# Patient Record
Sex: Female | Born: 1970 | Race: White | Hispanic: No | Marital: Married
Health system: Midwestern US, Community
[De-identification: ages and names within clinical notes are randomized; demographics above are authoritative.]

## PROBLEM LIST (undated history)

## (undated) DIAGNOSIS — D72819 Decreased white blood cell count, unspecified: Secondary | ICD-10-CM

## (undated) DIAGNOSIS — M1990 Unspecified osteoarthritis, unspecified site: Secondary | ICD-10-CM

## (undated) DIAGNOSIS — G43909 Migraine, unspecified, not intractable, without status migrainosus: Secondary | ICD-10-CM

## (undated) DIAGNOSIS — F419 Anxiety disorder, unspecified: Secondary | ICD-10-CM

## (undated) DIAGNOSIS — Z1239 Encounter for other screening for malignant neoplasm of breast: Principal | ICD-10-CM

## (undated) DIAGNOSIS — Z01419 Encounter for gynecological examination (general) (routine) without abnormal findings: Secondary | ICD-10-CM

## (undated) HISTORY — DX: Migraine, unspecified, not intractable, without status migrainosus: G43.909

## (undated) HISTORY — DX: Unspecified osteoarthritis, unspecified site: M19.90

## (undated) HISTORY — DX: Decreased white blood cell count, unspecified: D72.819

## (undated) HISTORY — PX: CYSTECTOMY: SUR359

## (undated) HISTORY — PX: GANGLION CYST EXCISION: SHX1691

---

## 2003-12-12 ENCOUNTER — Other Ambulatory Visit: Admission: RE | Admit: 2003-12-12 | Discharge: 2003-12-12 | Payer: Self-pay | Admitting: Obstetrics and Gynecology

## 2004-06-20 ENCOUNTER — Inpatient Hospital Stay (HOSPITAL_COMMUNITY): Admission: AD | Admit: 2004-06-20 | Discharge: 2004-06-20 | Payer: Self-pay | Admitting: Obstetrics and Gynecology

## 2004-07-02 ENCOUNTER — Inpatient Hospital Stay (HOSPITAL_COMMUNITY): Admission: AD | Admit: 2004-07-02 | Discharge: 2004-07-04 | Payer: Self-pay | Admitting: Obstetrics and Gynecology

## 2010-07-19 ENCOUNTER — Emergency Department (HOSPITAL_BASED_OUTPATIENT_CLINIC_OR_DEPARTMENT_OTHER): Admission: EM | Admit: 2010-07-19 | Discharge: 2010-07-19 | Payer: Self-pay | Admitting: Emergency Medicine

## 2010-07-19 ENCOUNTER — Ambulatory Visit: Payer: Self-pay | Admitting: Diagnostic Radiology

## 2011-03-21 NOTE — H&P (Signed)
Tina Hood, Tina Hood                        ACCOUNT NO.:  [id number]   MEDICAL RECORD NO.:  [id number]                   PATIENT TYPE:  INP   LOCATION:  9166                                 FACILITY:  [facility]   PHYSICIAN:  Lenoard Aden, M.D.             DATE OF BIRTH:  21-Jul-1971   DATE OF ADMISSION:  07/02/2004  DATE OF DISCHARGE:                                HISTORY & PHYSICAL   CHIEF COMPLAINT:  Labor.   HISTORY OF PRESENT ILLNESS:  The patient is a 40 year old white female, G2,  P68, EDD July 05, 2004, at 39+ weeks in active labor.   ALLERGIES:  The patient has allergies to Daypro.   MEDICATIONS:  Prenatal vitamins.   PAST OBSTETRICAL HISTORY:  She has a history of a spontaneous vaginal  delivery in April 2002 of a female.  No other medical or surgical  hospitalizations.   PAST MEDICAL HISTORY:  She also has a history of ganglion cyst removal and  migraine headaches.   FAMILY HISTORY:  There is a family history of liver cancer, hypertension,  epilepsy.   PRENATAL LABORATORY DATA:  Blood type 0 positive.  Rubella immune.  Hepatitis negative.  HIV declined.  GBS negative.   PHYSICAL EXAMINATION:  GENERAL:  She is a well-developed, well-nourished  white female in no apparent distress.  HEENT:  Normal.  LUNGS:  Clear.  HEART:  Regular rate and rhythm.  ABDOMEN:  Soft, gravid, nontender.  Estimated fetal weight is 7-1/2 to 8  pounds.  PELVIC:  Cervix is 3-4 cm, 89% effaced, vertex and 0 station.  EXTREMITIES:  No cords.  NEUROLOGICAL:  Nonfocal.   ASSESSMENT:  Term intrauterine pregnancy in active labor.   PLAN:  Proceed with admission and attempts at vaginal delivery.                                               Lenoard Aden, M.D.    RJT/MEDQ  D:  07/02/2004  T:  07/02/2004  Job:  [id number]

## 2011-03-21 NOTE — Op Note (Signed)
NAMEBURGUNDY, MATUSZAK                        ACCOUNT NO.:  [id number]   MEDICAL RECORD NO.:  [id number]                   PATIENT TYPE:  INP   LOCATION:  9112                                 FACILITY:  [facility]   PHYSICIAN:  Lenoard Aden, M.D.             DATE OF BIRTH:  1971-08-06   DATE OF PROCEDURE:  07/02/2004  DATE OF DISCHARGE:                                 OPERATIVE REPORT   INDICATIONS FOR PROCEDURE:  Nonreassuring fetal heart rate tracing with  fetal bradycardia.   DESCRIPTION OF PROCEDURE:  After being apprised of decreased fetal heart  rate in the 70-80 beat per minute range, the patient is apprised of the  risks and benefits of vacuum assistance including small incidence of  cephalohematoma, scalp laceration and intracranial hemorrhage. A kiwi cup is  placed in the fetal vertex LOA less than 45 degrees at +3 to +4 station for  one pull over a second degree midline laceration of a fullterm living female,  Apgar's 8 & 9. Cord blood collected, placenta delivered spontaneously  intact. Three vessel cord noted. Apgar's are 8 & 9. No complications.  Estimated blood loss 500 mL. The whole procedure was done under epidural  anesthetic.  The mother and baby recovering well. Repair is with a 3-0  Rapide, bulb suction was performed.                                               Lenoard Aden, M.D.    RJT/MEDQ  D:  07/02/2004  T:  07/02/2004  Job:  [id number]

## 2011-06-19 ENCOUNTER — Emergency Department (HOSPITAL_BASED_OUTPATIENT_CLINIC_OR_DEPARTMENT_OTHER)
Admission: EM | Admit: 2011-06-19 | Discharge: 2011-06-19 | Disposition: A | Discharge status: Home / Self Care | Payer: Managed Care, Other (non HMO) | Attending: Emergency Medicine | Admitting: Emergency Medicine

## 2011-06-19 ENCOUNTER — Encounter: Payer: Self-pay | Admitting: *Deleted

## 2011-06-19 DIAGNOSIS — IMO0001 Reserved for inherently not codable concepts without codable children: Secondary | ICD-10-CM | POA: Insufficient documentation

## 2011-06-19 DIAGNOSIS — R209 Unspecified disturbances of skin sensation: Secondary | ICD-10-CM | POA: Insufficient documentation

## 2011-06-19 DIAGNOSIS — Y92009 Unspecified place in unspecified non-institutional (private) residence as the place of occurrence of the external cause: Secondary | ICD-10-CM | POA: Insufficient documentation

## 2011-06-19 DIAGNOSIS — W57XXXA Bitten or stung by nonvenomous insect and other nonvenomous arthropods, initial encounter: Secondary | ICD-10-CM

## 2011-06-19 HISTORY — DX: Anxiety disorder, unspecified: F41.9

## 2011-06-19 NOTE — ED Notes (Signed)
Pt st's she was "gardening" and stuck her hand in a glove when she was bit by an ant

## 2011-06-19 NOTE — ED Provider Notes (Signed)
  I performed a history and physical examination of Tina Hood and discussed her management with Ruby Cola, PA.  I agree with the history, physical, assessment, and plan of care, with the following exceptions: None  Minimal swelling present on R hand.  Reaction has resolved as pt took benadryl prior to arrival.  No wheezing  I was present for the following procedures: None Time Spent in Critical Care of the patient: None Time spent in discussions with the patient and family: 5 min  Tildon Husky, MD 06/19/11 1306

## 2011-06-19 NOTE — ED Provider Notes (Signed)
History     CSN: [id number] Arrival date & time: 06/19/2011 12:16 PM  Chief Complaint  Patient presents with  . Insect Bite   HPI Pt was bitten by an insect on right middle finger inside gardening glove this am.  Had immediate pain associated w/ paresthesias that spread up to mid-upper arm.  Took benadryl but symptoms have not improved.  No known insect allergies and denies dyspnea, throat tightness and tongue/lip edema.  Her son saw insect and believes it was a red velvet ant.  Concerned that the insect may have been poisonous.    Past Medical History  Diagnosis Date  . Anxiety     Past Surgical History  Procedure Date  . Ganglion cyst excision     No family history on file.  History  Substance Use Topics  . Smoking status: Never Smoker   . Smokeless tobacco: Not on file  . Alcohol Use: Yes    OB History    Grav Para Term Preterm Abortions TAB SAB Ect Mult Living                  Review of Systems  All other systems reviewed and are negative.    Physical Exam  BP 111/54  Pulse 86  Temp(Src) 98.6 F (37 C) (Oral)  Resp 20  Ht 5\' 4"  (1.626 m)  Wt 140 lb (63.504 kg)  BMI 24.03 kg/m2  SpO2 100%  LMP 05/21/2011  Physical Exam  Nursing note and vitals reviewed. Constitutional: She is oriented to person, place, and time. She appears well-developed and well-nourished. No distress.  HENT:  Head: Normocephalic and atraumatic.  Mouth/Throat: Oropharynx is clear and moist.       No lip/tongue edema  Eyes:       Normal appearance  Neck: Normal range of motion.  Cardiovascular: Normal rate and regular rhythm.   Pulmonary/Chest: Effort normal and breath sounds normal.  Neurological: She is alert and oriented to person, place, and time.  Skin: Skin is warm and dry. No rash noted.       1mm, erythematous, macular lesion on medial cuticle of right middle finger.  Non-tender.  Full ROM of DIP and PIP joints. Sensation of entire RUE intact.    Psychiatric:   Anxious appearing    ED Course  Procedures  MDM Pt presents w/ painful insect bite of right middle finger w/ associated paresthesias.  No signs of infection or anaphylaxis on exam.  Pt reassured and return precautions discussed.  Recommended ice and benadryl.      Otilio Miu, [place] 06/19/11 1252

## 2011-06-19 NOTE — ED Notes (Signed)
Patient states she put on a pair of gloves from the garage and felt a bite on her 3rd right finger tip.  Took off gloves and shook them out.  States a red ant fell out.  Took benadryl 25mg  and used ice approximately one hour ago.  States she is having a tingling sensation numb and tingling.

## 2015-07-20 LAB — HM PAP SMEAR
PAP Smear, External: NORMAL
Pap Smear, External: NORMAL

## 2017-02-25 DIAGNOSIS — R053 Chronic cough: Secondary | ICD-10-CM | POA: Insufficient documentation

## 2017-02-25 DIAGNOSIS — J45909 Unspecified asthma, uncomplicated: Secondary | ICD-10-CM | POA: Insufficient documentation

## 2017-02-25 DIAGNOSIS — J309 Allergic rhinitis, unspecified: Secondary | ICD-10-CM | POA: Insufficient documentation

## 2019-05-20 ENCOUNTER — Ambulatory Visit: Attending: Family Medicine | Primary: Family Medicine

## 2019-05-20 ENCOUNTER — Ambulatory Visit
Admit: 2019-05-20 | Discharge: 2019-05-20 | Payer: PRIVATE HEALTH INSURANCE | Attending: Family Medicine | Primary: Family Medicine

## 2019-05-20 DIAGNOSIS — Z0001 Encounter for general adult medical examination with abnormal findings: Secondary | ICD-10-CM

## 2019-05-20 NOTE — Progress Notes (Signed)
 Chief Complaint   Patient presents with    Establish Care

## 2019-05-20 NOTE — Addendum Note (Signed)
Addendum Note by Scheryl Marten, MD at 05/20/19 1100                Author: Scheryl Marten, MD  Service: --  Author Type: Physician       Filed: 06/15/19 1659  Encounter Date: 05/20/2019  Status: Signed          Editor: Scheryl Marten, MD (Physician)          Addended by: Scheryl Marten on: 06/15/2019 04:59 PM    Modules accepted: Orders

## 2019-05-20 NOTE — Progress Notes (Signed)
Family Medicine Initial Office Visit  Patient: Anita SpiceMelanie Toulouse  1971-07-06, 48 y.o., female  Encounter Date: 05/20/2019    ASSESSMENT & PLAN    ICD-10-CM ICD-9-CM    1. Encounter for well adult exam with abnormal findings  Z00.01 V70.0    2. Anxiety  F41.9 300.00 DRUG SCREEN, URINE   3. Chronic prescription benzodiazepine use  Z79.899 V58.69 DRUG SCREEN, URINE   4. Dense breast tissue on mammogram  R92.2 793.89 MAM 3D TOMO W MAMMO BI SCREENING INCL CAD   5. Screening for breast cancer  Z12.39 V76.10 MAM 3D TOMO W MAMMO BI SCREENING INCL CAD   6. Screening for lipid disorders  Z13.220 V77.91 METABOLIC PANEL, COMPREHENSIVE      LIPID PANEL   7. Screening for diabetes mellitus  Z13.1 V77.1 CBC W/O DIFF      METABOLIC PANEL, COMPREHENSIVE     Orders Placed This Encounter   ??? MAM 3D TOMO W MAMMO BI SCREENING INCL CAD     Standing Status:   Future     Standing Expiration Date:   06/19/2020     Order Specific Question:   Is Patient Pregnant?     Answer:   No   ??? DRUG SCREEN, URINE     Standing Status:   Future     Standing Expiration Date:   05/19/2020   ??? CBC W/O DIFF     Standing Status:   Future     Standing Expiration Date:   05/19/2020   ??? METABOLIC PANEL, COMPREHENSIVE     Standing Status:   Future     Standing Expiration Date:   05/19/2020   ??? LIPID PANEL     Standing Status:   Future     Standing Expiration Date:   05/19/2020   ??? cetirizine-pseudoePHEDrine (ZyrTEC-D) 5-120 mg Tb12     Sig: Take 1 Tab by mouth two (2) times a day.   ??? B.infantis-B.ani-B.long-B.bifi (Probiotic 4X) 10-15 mg TbEC     Sig: Take  by mouth.   ??? multivitamin (ONE A DAY) tablet     Sig: Take 1 Tab by mouth daily.   ??? ALPRAZolam (Xanax) 0.5 mg tablet     Sig: Take  by mouth.   ??? [facility] PAP SMEAR     This external order was created through the Results Console.     Patient Instructions   1. Encounter for well adult exam with abnormal findings  Well-appearing adult female presents today to establish care and for her well adult exam, reviewed health  maintenance, we were able to find that her Pap smears done within the past 5 years and we updated it in the chart, she will be due next year and she will also be due for colon cancer screening at that time.  She was prepared for this today.  She is up-to-date on her tetanus vaccine.  She gets her flu shot annually.  She otherwise eats a healthy diet and exercises regularly with a goal of staying healthy.  Anticipatory guidance today  2. Anxiety  Using about 60 Xanax a year, she does not have symptoms of daily anxiety but instead seems to be having panic attacks periodically.  She would like to continue on this for now as long she does not need to increase her dose.  She will go for urine drug screening, I did review the PMP, her last prescription was in 2018 and she is just getting you are running out from this prescription.  She is otherwise feeling healthy and reports to me that this has been the thing that has helped with her panic attacks.  - DRUG SCREEN, URINE; Future    3. Chronic prescription benzodiazepine use  Discussed are controlled substance prescribing contract, patient's understanding and agreeable, shoulder for urine drug screen and if negative I will prescribe 60 Xanax, she is needing to increase her dose she will need to discuss with me the potential for using an SSRI instead  - DRUG SCREEN, URINE; Future    4. Dense breast tissue on mammogram  History of dense breast tissue on mammogram, patient would prefer to have a 3D mammogram rather than a traditional screening mammogram, I have ordered this at her behest and I recommend she get annual mammogram screenings  - MAM 3D TOMO W MAMMO BI SCREENING INCL CAD; Future    5. Screening for breast cancer  As above  - MAM 3D TOMO W MAMMO BI SCREENING INCL CAD; Future    6. Screening for lipid disorders  Routine screening  - METABOLIC PANEL, COMPREHENSIVE; Future  - LIPID PANEL; Future    7. Screening for diabetes mellitus  Routine screening  - CBC W/O DIFF;  Future  - METABOLIC PANEL, COMPREHENSIVE; Future        CHIEF COMPLAINT  Chief Complaint   Patient presents with   ??? Establish Care       SUBJECTIVE  Anita Roman is a 48 y.o. female presenting today for establishing care.   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  Patient lives in a house with husband and 2 boys 5118 and 15 and dog  Patient was [place] seen by primary care 2 years ago (in [place], [facility])  Patient last saw a dentist in the last year  Patient last had eye exam a few months ago    Exercise: walking tread climber, eliptical, stength 5 days a week  Diet / Weight:eating healthy, weight is stable, niece is a Systems analystersonal Trainer    Tobacco:no  EtOH:occassional  Illicit Substances:no    Sexual Activity: yes one female partner  Gynecologist: no  Last Pap smear 2016, normal  Reviewed from care everywhere    Has anxiety, she reports that she last got her prescription in 2018 and she is just run out of it. She reports that she takes about 60 pills a year or more.     Reports hx of low vit k as well  Review of Systems  A 12 point review of systems was negative except as noted here or in the HPI.    OBJECTIVE  Visit Vitals  BP 112/74   Pulse 89   Temp 97 ??F (36.1 ??C) (Temporal)   Resp 20   Ht 5\' 4"  (1.626 m)   Wt 159 lb (72.1 kg)   SpO2 99%   BMI 27.29 kg/m??       Physical Exam  Vitals signs and nursing note reviewed.   Constitutional:       General: She is not in acute distress.     Appearance: Normal appearance. She is well-developed. She is not diaphoretic.   HENT:      Head: Normocephalic and atraumatic.      Right Ear: External ear normal.      Left Ear: External ear normal.      Nose: Nose normal. No congestion.      Mouth/Throat:      Mouth: Mucous membranes are moist.  Pharynx: Oropharynx is clear. No oropharyngeal exudate or posterior oropharyngeal erythema.   Eyes:      General: No scleral icterus.        Right eye: No discharge.         Left eye: No discharge.       Extraocular Movements: Extraocular movements intact.      Conjunctiva/sclera: Conjunctivae normal.      Pupils: Pupils are equal, round, and reactive to light.   Neck:      Musculoskeletal: Normal range of motion and neck supple.      Vascular: No carotid bruit.   Cardiovascular:      Rate and Rhythm: Normal rate and regular rhythm.      Heart sounds: Normal heart sounds. No murmur. No friction rub. No gallop.    Pulmonary:      Effort: Pulmonary effort is normal. No respiratory distress.      Breath sounds: Normal breath sounds. No stridor. No wheezing, rhonchi or rales.   Abdominal:      General: Bowel sounds are normal. There is no distension.      Palpations: Abdomen is soft.      Tenderness: There is no abdominal tenderness.   Musculoskeletal: Normal range of motion.         General: No tenderness.      Right lower leg: No edema.      Left lower leg: No edema.   Lymphadenopathy:      Cervical: No cervical adenopathy.   Skin:     General: Skin is warm and dry.      Capillary Refill: Capillary refill takes less than 2 seconds.      Findings: No rash.   Neurological:      General: No focal deficit present.      Mental Status: She is alert and oriented to person, place, and time.      Coordination: Coordination normal.      Gait: Gait normal.   Psychiatric:         Mood and Affect: Mood normal.         Behavior: Behavior normal.         Thought Content: Thought content normal.         Judgment: Judgment normal.         Results for orders placed or performed in visit on 05/20/19   HM PAP SMEAR   Result Value Ref Range    Pap Smear, External normal        HISTORICAL  Reviewed and updated today, and as noted below:    Past Medical History:   Diagnosis Date   ??? Allergies    ??? Anxiety      Past Surgical History:   Procedure Laterality Date   ??? HX CYSTECTOMY       Family History   Problem Relation Age of Onset   ??? Hypertension Mother    ??? Hypertension Father    ??? Cancer Father    ??? Cancer Sister      Social History      Tobacco Use   Smoking Status Never Smoker   Smokeless Tobacco Never Used     Social History     Socioeconomic History   ??? Marital status: MARRIED     Spouse name: Not on file   ??? Number of children: Not on file   ??? Years of education: Not on file   ??? Highest education level: Not on file  Tobacco Use   ??? Smoking status: Never Smoker   ??? Smokeless tobacco: Never Used   Substance and Sexual Activity   ??? Alcohol use: Yes   ??? Drug use: Not Currently     Allergies   Allergen Reactions   ??? Insect Venom Anaphylaxis   ??? Daypro [Oxaprozin] Hives   ??? Lodine [Etodolac] Hives       Office Visit on 05/20/2019   Component Date Value Ref Range Status   ??? Pap Smear, External 07/20/2015 normal   Final         Desma Maximaitlin Kelly Detrell Umscheid, MD  [facility]  05/20/19 11:43 AM    Portions of this note may have been populated using smart dictation software and may have "sounds-like" errors present.     Pt was counseled on risks, benefits and alternatives of treatment options. All questions were asked and answered and the patient was agreeable with the treatment plan as outlined.

## 2019-05-20 NOTE — Addendum Note (Signed)
Addended by: Scheryl Marten on: 06/15/2019 04:59 PM     Modules accepted: Orders

## 2019-05-20 NOTE — Progress Notes (Signed)
Chief Complaint   Patient presents with   ??? Establish Care

## 2019-05-20 NOTE — Patient Instructions (Signed)
1. Encounter for well adult exam with abnormal findings  Well-appearing adult female presents today to establish care and for her well adult exam, reviewed health maintenance, we were able to find that her Pap smears done within the past 5 years and we updated it in the chart, she will be due next year and she will also be due for colon cancer screening at that time.  She was prepared for this today.  She is up-to-date on her tetanus vaccine.  She gets her flu shot annually.  She otherwise eats a healthy diet and exercises regularly with a goal of staying healthy.  Anticipatory guidance today  2. Anxiety  Using about 60 Xanax a year, she does not have symptoms of daily anxiety but instead seems to be having panic attacks periodically.  She would like to continue on this for now as long she does not need to increase her dose.  She will go for urine drug screening, I did review the PMP, her last prescription was in 2018 and she is just getting you are running out from this prescription.  She is otherwise feeling healthy and reports to me that this has been the thing that has helped with her panic attacks.  - DRUG SCREEN, URINE; Future    3. Chronic prescription benzodiazepine use  Discussed are controlled substance prescribing contract, patient's understanding and agreeable, shoulder for urine drug screen and if negative I will prescribe 60 Xanax, she is needing to increase her dose she will need to discuss with me the potential for using an SSRI instead  - DRUG SCREEN, URINE; Future    4. Dense breast tissue on mammogram  History of dense breast tissue on mammogram, patient would prefer to have a 3D mammogram rather than a traditional screening mammogram, I have ordered this at her behest and I recommend she get annual mammogram screenings  - MAM 3D TOMO W MAMMO BI SCREENING INCL CAD; Future    5. Screening for breast cancer  As above  - MAM 3D TOMO W MAMMO BI SCREENING INCL CAD; Future    6. Screening for lipid  disorders  Routine screening  - METABOLIC PANEL, COMPREHENSIVE; Future  - LIPID PANEL; Future    7. Screening for diabetes mellitus  Routine screening  - CBC W/O DIFF; Future  - METABOLIC PANEL, COMPREHENSIVE; Future

## 2019-05-20 NOTE — Progress Notes (Signed)
Family Medicine Initial Office Visit  Patient: Anita Roman  [birth year], 48 y.o., female  Encounter Date: 05/20/2019    ASSESSMENT & PLAN    ICD-10-CM ICD-9-CM    1. Encounter for well adult exam with abnormal findings  Z00.01 V70.0    2. Anxiety  F41.9 300.00 DRUG SCREEN, URINE   3. Chronic prescription benzodiazepine use  Z79.899 V58.69 DRUG SCREEN, URINE   4. Dense breast tissue on mammogram  R92.2 793.89 MAM 3D TOMO W MAMMO BI SCREENING INCL CAD   5. Screening for breast cancer  Z12.39 V76.10 MAM 3D TOMO W MAMMO BI SCREENING INCL CAD   6. Screening for lipid disorders  Z13.220 V77.91 METABOLIC PANEL, COMPREHENSIVE      LIPID PANEL   7. Screening for diabetes mellitus  Z13.1 V77.1 CBC W/O DIFF      METABOLIC PANEL, COMPREHENSIVE     Orders Placed This Encounter   ??? MAM 3D TOMO W MAMMO BI SCREENING INCL CAD     Standing Status:   Future     Standing Expiration Date:   06/19/2020     Order Specific Question:   Is Patient Pregnant?     Answer:   No   ??? DRUG SCREEN, URINE     Standing Status:   Future     Standing Expiration Date:   05/19/2020   ??? CBC W/O DIFF     Standing Status:   Future     Standing Expiration Date:   05/19/2020   ??? METABOLIC PANEL, COMPREHENSIVE     Standing Status:   Future     Standing Expiration Date:   05/19/2020   ??? LIPID PANEL     Standing Status:   Future     Standing Expiration Date:   05/19/2020   ??? cetirizine-pseudoePHEDrine (ZyrTEC-D) 5-120 mg Tb12     Sig: Take 1 Tab by mouth two (2) times a day.   ??? B.infantis-B.ani-B.long-B.bifi (Probiotic 4X) 10-15 mg TbEC     Sig: Take  by mouth.   ??? multivitamin (ONE A DAY) tablet     Sig: Take 1 Tab by mouth daily.   ??? ALPRAZolam (Xanax) 0.5 mg tablet     Sig: Take  by mouth.   ??? HM PAP SMEAR     This external order was created through the Results Console.     Patient Instructions   1. Encounter for well adult exam with abnormal findings  Well-appearing adult female presents today to establish care and for her  well adult exam, reviewed health maintenance, we were able to find that her Pap smears done within the past 5 years and we updated it in the chart, she will be due next year and she will also be due for colon cancer screening at that time.  She was prepared for this today.  She is up-to-date on her tetanus vaccine.  She gets her flu shot annually.  She otherwise eats a healthy diet and exercises regularly with a goal of staying healthy.  Anticipatory guidance today  2. Anxiety  Using about 60 Xanax a year, she does not have symptoms of daily anxiety but instead seems to be having panic attacks periodically.  She would like to continue on this for now as long she does not need to increase her dose.  She will go for urine drug screening, I did review the PMP, her last prescription was in 2018 and she is just getting you are running out from this prescription.  She is otherwise feeling healthy and reports to me that this has been the thing that has helped with her panic attacks.  - DRUG SCREEN, URINE; Future    3. Chronic prescription benzodiazepine use  Discussed are controlled substance prescribing contract, patient's understanding and agreeable, shoulder for urine drug screen and if negative I will prescribe 60 Xanax, she is needing to increase her dose she will need to discuss with me the potential for using an SSRI instead  - DRUG SCREEN, URINE; Future    4. Dense breast tissue on mammogram  History of dense breast tissue on mammogram, patient would prefer to have a 3D mammogram rather than a traditional screening mammogram, I have ordered this at her behest and I recommend she get annual mammogram screenings  - MAM 3D TOMO W MAMMO BI SCREENING INCL CAD; Future    5. Screening for breast cancer  As above  - MAM 3D TOMO W MAMMO BI SCREENING INCL CAD; Future    6. Screening for lipid disorders  Routine screening  - METABOLIC PANEL, COMPREHENSIVE; Future  - LIPID PANEL; Future    7. Screening for diabetes mellitus   Routine screening  - CBC W/O DIFF; Future  - METABOLIC PANEL, COMPREHENSIVE; Future        CHIEF COMPLAINT  Chief Complaint   Patient presents with   ??? Establish Care       SUBJECTIVE  Anita Roman is a 48 y.o. female presenting today for establishing care.   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, Scientist, physiological  Patient lives in a house with husband and 2 boys 32 and 60 and dog  Patient was last seen by primary care 2 years ago (in [place], [facility])  Patient last saw a dentist in the last year  Patient last had eye exam a few months ago    Exercise: walking tread climber, eliptical, stength 5 days a week  Diet / Weight:eating healthy, weight is stable, niece is a Physiological scientist    Tobacco:no  EtOH:occassional  Illicit Substances:no    Sexual Activity: yes one female partner  Gynecologist: no  Last Pap smear 2016, normal  Reviewed from care everywhere    Has anxiety, she reports that she last got her prescription in 2018 and she is just run out of it. She reports that she takes about 60 pills a year or more.     Reports hx of low vit k as well  Review of Systems  A 12 point review of systems was negative except as noted here or in the HPI.    OBJECTIVE  Visit Vitals  BP 112/74   Pulse 89   Temp 97 ??F (36.1 ??C) (Temporal)   Resp 20   Ht 5\' 4"  (1.626 m)   Wt 159 lb (72.1 kg)   SpO2 99%   BMI 27.29 kg/m??       Physical Exam  Vitals signs and nursing note reviewed.   Constitutional:       General: She is not in acute distress.     Appearance: Normal appearance. She is well-developed. She is not diaphoretic.   HENT:      Head: Normocephalic and atraumatic.      Right Ear: External ear normal.      Left Ear: External ear normal.      Nose: Nose normal. No congestion.      Mouth/Throat:      Mouth: Mucous membranes are moist.  Pharynx: Oropharynx is clear. No oropharyngeal exudate or posterior oropharyngeal erythema.   Eyes:      General: No scleral icterus.        Right eye: No discharge.          Left eye: No discharge.      Extraocular Movements: Extraocular movements intact.      Conjunctiva/sclera: Conjunctivae normal.      Pupils: Pupils are equal, round, and reactive to light.   Neck:      Musculoskeletal: Normal range of motion and neck supple.      Vascular: No carotid bruit.   Cardiovascular:      Rate and Rhythm: Normal rate and regular rhythm.      Heart sounds: Normal heart sounds. No murmur. No friction rub. No gallop.    Pulmonary:      Effort: Pulmonary effort is normal. No respiratory distress.      Breath sounds: Normal breath sounds. No stridor. No wheezing, rhonchi or rales.   Abdominal:      General: Bowel sounds are normal. There is no distension.      Palpations: Abdomen is soft.      Tenderness: There is no abdominal tenderness.   Musculoskeletal: Normal range of motion.         General: No tenderness.      Right lower leg: No edema.      Left lower leg: No edema.   Lymphadenopathy:      Cervical: No cervical adenopathy.   Skin:     General: Skin is warm and dry.      Capillary Refill: Capillary refill takes less than 2 seconds.      Findings: No rash.   Neurological:      General: No focal deficit present.      Mental Status: She is alert and oriented to person, place, and time.      Coordination: Coordination normal.      Gait: Gait normal.   Psychiatric:         Mood and Affect: Mood normal.         Behavior: Behavior normal.         Thought Content: Thought content normal.         Judgment: Judgment normal.         Results for orders placed or performed in visit on 05/20/19   HM PAP SMEAR   Result Value Ref Range    Pap Smear, External normal        HISTORICAL  Reviewed and updated today, and as noted below:    Past Medical History:   Diagnosis Date   ??? Allergies    ??? Anxiety      Past Surgical History:   Procedure Laterality Date   ??? HX CYSTECTOMY       Family History   Problem Relation Age of Onset   ??? Hypertension Mother    ??? Hypertension Father    ??? Cancer Father     ??? Cancer Sister      Social History     Tobacco Use   Smoking Status Never Smoker   Smokeless Tobacco Never Used     Social History     Socioeconomic History   ??? Marital status: MARRIED     Spouse name: Not on file   ??? Number of children: Not on file   ??? Years of education: Not on file   ??? Highest education level: Not on file  Tobacco Use   ??? Smoking status: Never Smoker   ??? Smokeless tobacco: Never Used   Substance and Sexual Activity   ??? Alcohol use: Yes   ??? Drug use: Not Currently     Allergies   Allergen Reactions   ??? Insect Venom Anaphylaxis   ??? Daypro [Oxaprozin] Hives   ??? Lodine [Etodolac] Hives       Office Visit on 05/20/2019   Component Date Value Ref Range Status   ??? Pap Smear, External 07/20/2015 normal   Final         Desma Maximaitlin Kelly Detrell Umscheid, MD  [facility]  05/20/19 11:43 AM    Portions of this note may have been populated using smart dictation software and may have "sounds-like" errors present.     Pt was counseled on risks, benefits and alternatives of treatment options. All questions were asked and answered and the patient was agreeable with the treatment plan as outlined.

## 2019-06-15 ENCOUNTER — Inpatient Hospital Stay: Admit: 2019-06-15 | Payer: BLUE CROSS/BLUE SHIELD | Primary: Family Medicine

## 2019-06-15 LAB — LIPID PANEL
CHOL/HDL Ratio: 1.9 (ref 0.0–5.0)
Chol/HDL Ratio: 1.9 (ref 0.0–5.0)
Cholesterol, Total: 200 MG/DL — ABNORMAL HIGH (ref <200)
Cholesterol, total: 200 MG/DL — ABNORMAL HIGH (ref <200)
HDL Cholesterol: 103 MG/DL
HDL: 103 MG/DL
LDL Calculated: 87.4 MG/DL (ref 0–100)
LDL, calculated: 87.4 MG/DL (ref 0–100)
Triglyceride: 48 MG/DL (ref <150)
Triglycerides: 48 MG/DL (ref <150)
VLDL Cholesterol Calculated: 9.6 MG/DL
VLDL, calculated: 9.6 MG/DL

## 2019-06-15 LAB — DRUG SCREEN, URINE
AMPHETAMINES: NEGATIVE
Amphetamine Screen, Ur: NEGATIVE
BARBITURATES: NEGATIVE
BENZODIAZEPINES: NEGATIVE
Barbiturate Screen, Ur: NEGATIVE
Benzodiazepine Screen, Urine: NEGATIVE
COCAINE: NEGATIVE
Cocaine Screen Urine: NEGATIVE
METHADONE: NEGATIVE
Methadone Screen, Urine: NEGATIVE
OPIATES: NEGATIVE
Opiate Scrn, Ur: NEGATIVE
PCP(PHENCYCLIDINE): NEGATIVE
Phencyclidine (PCP), Screen, Urine: NEGATIVE
THC (TH-CANNABINOL): NEGATIVE
THC Screen, Urine: NEGATIVE

## 2019-06-15 LAB — COMPREHENSIVE METABOLIC PANEL
ALT: 17 U/L (ref 12–78)
AST: 19 U/L (ref 15–37)
Albumin/Globulin Ratio: 1.3 (ref 1.1–2.2)
Albumin: 4.5 g/dL (ref 3.5–5.0)
Alkaline Phosphatase: 79 U/L (ref 45–117)
Anion Gap: 7 mmol/L (ref 5–15)
BUN/Creatinine Ratio: 13 (ref 12–20)
BUN: 13 MG/DL (ref 6–20)
CO2: 25 mmol/L (ref 21–32)
Calcium: 9 MG/DL (ref 8.5–10.1)
Chloride: 104 mmol/L (ref 97–108)
Creatinine: 1 MG/DL (ref 0.55–1.02)
GFR African American: >60 mL/min/{1.73_m2} (ref >60)
Globulin: 3.4 g/dL (ref 2.0–4.0)
Glucose: 90 mg/dL (ref 65–100)
Potassium: 3.9 mmol/L (ref 3.5–5.1)
Sodium: 136 mmol/L (ref 136–145)
Total Bilirubin: 0.6 MG/DL (ref 0.2–1.0)
Total Protein: 7.9 g/dL (ref 6.4–8.2)
eGFR NON-AA: 59 mL/min/{1.73_m2} — ABNORMAL LOW (ref >60)

## 2019-06-15 LAB — CBC
Hematocrit: 43.9 % (ref 35.0–47.0)
Hemoglobin: 13.9 g/dL (ref 11.5–16.0)
MCH: 30 PG (ref 26.0–34.0)
MCHC: 31.7 g/dL (ref 30.0–36.5)
MCV: 94.8 FL (ref 80.0–99.0)
MPV: 9.8 FL (ref 8.9–12.9)
NRBC Absolute: 0 10*3/uL (ref 0.00–0.01)
Nucleated RBCs: 0 PER 100 WBC
Platelets: 252 10*3/uL (ref 150–400)
RBC: 4.63 M/uL (ref 3.80–5.20)
RDW: 12.6 % (ref 11.5–14.5)
WBC: 4.5 10*3/uL (ref 3.6–11.0)

## 2019-06-15 LAB — METABOLIC PANEL, COMPREHENSIVE
A-G Ratio: 1.3 (ref 1.1–2.2)
ALT (SGPT): 17 U/L (ref 12–78)
AST (SGOT): 19 U/L (ref 15–37)
Albumin: 4.5 g/dL (ref 3.5–5.0)
Alk. phosphatase: 79 U/L (ref 45–117)
Anion gap: 7 mmol/L (ref 5–15)
BUN/Creatinine ratio: 13 (ref 12–20)
BUN: 13 MG/DL (ref 6–20)
Bilirubin, total: 0.6 MG/DL (ref 0.2–1.0)
CO2: 25 mmol/L (ref 21–32)
Calcium: 9 MG/DL (ref 8.5–10.1)
Chloride: 104 mmol/L (ref 97–108)
Creatinine: 1 MG/DL (ref 0.55–1.02)
GFR est AA: >60 mL/min/{1.73_m2} (ref >60)
GFR est non-AA: 59 mL/min/{1.73_m2} — ABNORMAL LOW (ref >60)
Globulin: 3.4 g/dL (ref 2.0–4.0)
Glucose: 90 mg/dL (ref 65–100)
Potassium: 3.9 mmol/L (ref 3.5–5.1)
Protein, total: 7.9 g/dL (ref 6.4–8.2)
Sodium: 136 mmol/L (ref 136–145)

## 2019-06-15 LAB — CBC W/O DIFF
ABSOLUTE NRBC: 0 10*3/uL (ref 0.00–0.01)
HCT: 43.9 % (ref 35.0–47.0)
HGB: 13.9 g/dL (ref 11.5–16.0)
MCH: 30 PG (ref 26.0–34.0)
MCHC: 31.7 g/dL (ref 30.0–36.5)
MCV: 94.8 FL (ref 80.0–99.0)
MPV: 9.8 FL (ref 8.9–12.9)
NRBC: 0 PER 100 WBC
PLATELET: 252 10*3/uL (ref 150–400)
RBC: 4.63 M/uL (ref 3.80–5.20)
RDW: 12.6 % (ref 11.5–14.5)
WBC: 4.5 10*3/uL (ref 3.6–11.0)

## 2019-06-15 MED ORDER — ALPRAZOLAM 0.5 MG TAB
0.5 mg | ORAL_TABLET | Freq: Three times a day (TID) | ORAL | 0 refills | Status: DC | PRN
Start: 2019-06-15 — End: 2020-05-03

## 2019-06-15 NOTE — Progress Notes (Signed)
Labs still pending but CBC blood counts are all normal

## 2019-06-15 NOTE — Progress Notes (Signed)
UDS normal as expected, sent xanax to your pharmacy as we discussed  Normal blood count  Normal metabolic panel  Cholesterol "looks" hight because healthy HDL cholesterol is high, no need to treat this but monitor instead, good low triglycerides, LDL at range

## 2019-06-15 NOTE — Progress Notes (Signed)
Labs still pending but CBC blood counts are all normal

## 2019-06-15 NOTE — Progress Notes (Signed)
UDS normal as expected, sent xanax to your pharmacy as we discussed  Normal blood count  Normal metabolic panel  Cholesterol "looks" hight because healthy HDL cholesterol is high, no need to treat this but monitor instead, good low triglycerides, LDL at range

## 2020-01-24 DIAGNOSIS — M9904 Segmental and somatic dysfunction of sacral region: Secondary | ICD-10-CM | POA: Diagnosis not present

## 2020-01-24 DIAGNOSIS — M5386 Other specified dorsopathies, lumbar region: Secondary | ICD-10-CM | POA: Diagnosis not present

## 2020-01-24 DIAGNOSIS — M531 Cervicobrachial syndrome: Secondary | ICD-10-CM | POA: Diagnosis not present

## 2020-01-24 DIAGNOSIS — M9903 Segmental and somatic dysfunction of lumbar region: Secondary | ICD-10-CM | POA: Diagnosis not present

## 2020-01-25 DIAGNOSIS — Z1231 Encounter for screening mammogram for malignant neoplasm of breast: Secondary | ICD-10-CM | POA: Diagnosis not present

## 2020-01-25 LAB — HM MAMMOGRAPHY
Mammography, External: NORMAL
Mammography, External: NORMAL

## 2020-02-20 ENCOUNTER — Encounter

## 2020-03-02 DIAGNOSIS — M531 Cervicobrachial syndrome: Secondary | ICD-10-CM | POA: Diagnosis not present

## 2020-03-02 DIAGNOSIS — M9903 Segmental and somatic dysfunction of lumbar region: Secondary | ICD-10-CM | POA: Diagnosis not present

## 2020-03-02 DIAGNOSIS — M9904 Segmental and somatic dysfunction of sacral region: Secondary | ICD-10-CM | POA: Diagnosis not present

## 2020-03-02 DIAGNOSIS — M5386 Other specified dorsopathies, lumbar region: Secondary | ICD-10-CM | POA: Diagnosis not present

## 2020-04-13 DIAGNOSIS — M531 Cervicobrachial syndrome: Secondary | ICD-10-CM | POA: Diagnosis not present

## 2020-04-13 DIAGNOSIS — M9904 Segmental and somatic dysfunction of sacral region: Secondary | ICD-10-CM | POA: Diagnosis not present

## 2020-04-13 DIAGNOSIS — M9903 Segmental and somatic dysfunction of lumbar region: Secondary | ICD-10-CM | POA: Diagnosis not present

## 2020-04-13 DIAGNOSIS — M5386 Other specified dorsopathies, lumbar region: Secondary | ICD-10-CM | POA: Diagnosis not present

## 2020-05-03 ENCOUNTER — Encounter

## 2020-05-04 MED ORDER — ALPRAZOLAM 0.5 MG TAB
0.5 mg | ORAL_TABLET | Freq: Three times a day (TID) | ORAL | 0 refills | Status: DC | PRN
Start: 2020-05-04 — End: 2020-10-22

## 2020-05-11 DIAGNOSIS — M531 Cervicobrachial syndrome: Secondary | ICD-10-CM | POA: Diagnosis not present

## 2020-05-11 DIAGNOSIS — M5386 Other specified dorsopathies, lumbar region: Secondary | ICD-10-CM | POA: Diagnosis not present

## 2020-05-11 DIAGNOSIS — M9903 Segmental and somatic dysfunction of lumbar region: Secondary | ICD-10-CM | POA: Diagnosis not present

## 2020-05-11 DIAGNOSIS — M9904 Segmental and somatic dysfunction of sacral region: Secondary | ICD-10-CM | POA: Diagnosis not present

## 2020-06-22 DIAGNOSIS — M9904 Segmental and somatic dysfunction of sacral region: Secondary | ICD-10-CM | POA: Diagnosis not present

## 2020-06-22 DIAGNOSIS — M531 Cervicobrachial syndrome: Secondary | ICD-10-CM | POA: Diagnosis not present

## 2020-06-22 DIAGNOSIS — M9903 Segmental and somatic dysfunction of lumbar region: Secondary | ICD-10-CM | POA: Diagnosis not present

## 2020-06-22 DIAGNOSIS — M5386 Other specified dorsopathies, lumbar region: Secondary | ICD-10-CM | POA: Diagnosis not present

## 2020-07-27 DIAGNOSIS — M9904 Segmental and somatic dysfunction of sacral region: Secondary | ICD-10-CM | POA: Diagnosis not present

## 2020-07-27 DIAGNOSIS — M531 Cervicobrachial syndrome: Secondary | ICD-10-CM | POA: Diagnosis not present

## 2020-07-27 DIAGNOSIS — M9903 Segmental and somatic dysfunction of lumbar region: Secondary | ICD-10-CM | POA: Diagnosis not present

## 2020-07-27 DIAGNOSIS — M5386 Other specified dorsopathies, lumbar region: Secondary | ICD-10-CM | POA: Diagnosis not present

## 2020-08-24 DIAGNOSIS — M5386 Other specified dorsopathies, lumbar region: Secondary | ICD-10-CM | POA: Diagnosis not present

## 2020-08-24 DIAGNOSIS — M531 Cervicobrachial syndrome: Secondary | ICD-10-CM | POA: Diagnosis not present

## 2020-08-24 DIAGNOSIS — M9903 Segmental and somatic dysfunction of lumbar region: Secondary | ICD-10-CM | POA: Diagnosis not present

## 2020-08-24 DIAGNOSIS — M9904 Segmental and somatic dysfunction of sacral region: Secondary | ICD-10-CM | POA: Diagnosis not present

## 2020-09-03 DIAGNOSIS — M531 Cervicobrachial syndrome: Secondary | ICD-10-CM | POA: Diagnosis not present

## 2020-09-03 DIAGNOSIS — M5386 Other specified dorsopathies, lumbar region: Secondary | ICD-10-CM | POA: Diagnosis not present

## 2020-09-03 DIAGNOSIS — M9904 Segmental and somatic dysfunction of sacral region: Secondary | ICD-10-CM | POA: Diagnosis not present

## 2020-09-03 DIAGNOSIS — M9903 Segmental and somatic dysfunction of lumbar region: Secondary | ICD-10-CM | POA: Diagnosis not present

## 2020-09-13 ENCOUNTER — Ambulatory Visit: Attending: Family Medicine | Primary: Family Medicine

## 2020-09-13 ENCOUNTER — Ambulatory Visit
Admit: 2020-09-13 | Discharge: 2020-09-13 | Payer: BLUE CROSS/BLUE SHIELD | Attending: Family Medicine | Primary: Family Medicine

## 2020-09-13 ENCOUNTER — Inpatient Hospital Stay: Admit: 2020-09-14 | Payer: BLUE CROSS/BLUE SHIELD | Primary: Family Medicine

## 2020-09-13 DIAGNOSIS — Z01419 Encounter for gynecological examination (general) (routine) without abnormal findings: Secondary | ICD-10-CM

## 2020-09-13 DIAGNOSIS — Z124 Encounter for screening for malignant neoplasm of cervix: Secondary | ICD-10-CM

## 2020-09-13 LAB — HM PAP SMEAR: HM Pap smear: NORMAL

## 2020-09-13 MED ORDER — DOXYCYCLINE 100 MG TAB
100 mg | ORAL_TABLET | Freq: Two times a day (BID) | ORAL | 0 refills | Status: AC
Start: 2020-09-13 — End: 2020-09-23

## 2020-09-13 MED ORDER — SHINGRIX ADJUVANT COMPONENT (PF) INTRAMUSCULAR SUSPENSION
Freq: Once | INTRAMUSCULAR | 1 refills | Status: AC
Start: 2020-09-13 — End: 2020-09-13

## 2020-09-13 NOTE — Progress Notes (Signed)
Subjective:   49 y.o. female for Well Woman Check.  Patient's last menstrual period was 08/06/2020.    Social History: single partner, contraception - vasectomy.  Pertinent past medical hstory: migraines.    Patient Active Problem List   Diagnosis Code   ??? Anxiety F41.9   ??? Allergies T78.40XA     Past Medical History:   Diagnosis Date   ??? Allergies    ??? Anxiety      Past Surgical History:   Procedure Laterality Date   ??? HX CYSTECTOMY       Family History   Problem Relation Age of Onset   ??? Hypertension Mother    ??? Hypertension Father    ??? Cancer Father    ??? Cancer Sister      Social History     Tobacco Use   ??? Smoking status: Never Smoker   ??? Smokeless tobacco: Never Used   Substance Use Topics   ??? Alcohol use: Yes      Just saw eye doc changed prescription getting reading glasses    Having increasing migraine headaches--[place] are positional[place]    The panic attacks at night 1-2 x a month (wake up from sleep, palps, sob/gasping)  Every once in a while she snores    Allergies are bad in [place]    Exercise still-closing rings every day    Eating a healthy diet    Rare etoh  No tob  No drugs    Dentist: seeing regularly  Just saw derm--Dr Andi Hence    Sprained ankle in [place] the second ankle in [place] ([place]    ROS:  Feeling well. No dyspnea or chest pain on exertion.  No abdominal pain, change in bowel habits, black or bloody stools.  No urinary tract symptoms. GYN ROS: no breast pain or new or enlarging lumps on self exam, she complains of irregular periods over the last year. No neurological complaints.    Objective:     Visit Vitals  BP 118/73 (BP 1 Location: Left upper arm, BP Patient Position: Sitting, BP Cuff Size: Large adult)   Pulse 90   Temp 97.6 ??F (36.4 ??C) (Temporal)   Ht 5\' 4"  (1.626 m)   Wt 161 lb (73 kg)   LMP 08/06/2020   SpO2 98%   BMI 27.64 kg/m??     The patient appears well, alert, oriented x 3, in no distress.  ENT normal.  Neck  supple. No adenopathy or thyromegaly. PERLA. Lungs are clear, good air entry, no wheezes, rhonchi or rales. S1 and S2 normal, no murmurs, regular rate and rhythm. Abdomen soft without tenderness, guarding, mass or organomegaly. Extremities show no edema, normal peripheral pulses. Neurological is normal, no focal findings.    BREAST EXAM: breasts appear normal, no suspicious masses, no skin or nipple changes or axillary nodes    PELVIC EXAM: VULVA: normal appearing vulva with no masses, tenderness or lesions, VAGINA: normal appearing vagina with normal color and discharge, no lesions, CERVIX: erythematous cervical polyp noted, no discharge noted, DNA probe for chlamydia and GC obtained, cervical motion tenderness absent, UTERUS: uterus is normal size, shape, consistency and nontender, ADNEXA: normal adnexa in size, nontender and no masses, PAP: Pap smear done today, HPV test, exam chaperoned by 10/06/2020 MA    Assessment/Plan:   well woman  mammogram  pap smear  return annually or prn  1. Well woman exam with routine  gynecological exam  Well woman, routine care reivewed  - REFERRAL TO SLEEP STUDIES  - CBC W/O DIFF; Future  - METABOLIC PANEL, COMPREHENSIVE; Future  - LIPID PANEL; Future    2. Need for hepatitis C screening test  routine  - HEPATITIS C AB; Future    3. Screen for colon cancer  recommend  - REFERRAL FOR COLONOSCOPY    4. Screening for cervical cancer  Done today  Noted erythematous cervice, c/w cervicitis and noted polyp, no tenderness, no complaints, doxy 10d and f/u lab  - PAP IG,CT-NG, APTIMA HPV AND RFX 16/18,45 ([id number]); Future    5. Awakens from sleep at night  Recommend sleep eval  - REFERRAL TO SLEEP STUDIES    6. Anxiety  Can c/w xanax, if there is a change and increase would consider ssri daily  - CBC W/O DIFF; Future  - METABOLIC PANEL, COMPREHENSIVE; Future    7. Screening for lipid disorders  routine  - METABOLIC PANEL, COMPREHENSIVE; Future  - LIPID PANEL; Future    8. History  of shingles  Recommended, sent  - varicella-zoster (Shingrix Adjuvant Component-PF) injection; 1 Each by IntraMUSCular route once for 1 dose. Indications: shingles vaccination  Dispense: 1 Each; Refill: 1    9. Encounter for immunization  Recommended, sent  - varicella-zoster (Shingrix Adjuvant Component-PF) injection; 1 Each by IntraMUSCular route once for 1 dose. Indications: shingles vaccination  Dispense: 1 Each; Refill: 1    10. Laboratory examination ordered as part of a complete physical examination  As above  - CBC W/O DIFF; Future  - METABOLIC PANEL, COMPREHENSIVE; Future  - LIPID PANEL; Future  - HEPATITIS C AB; Future    11. Cervicitis  As above  - doxycycline (ADOXA) 100 mg tablet; Take 1 Tablet by mouth two (2) times a day for 10 days.  Dispense: 20 Tablet; Refill: 0    12. Cervical polyp  As above  .

## 2020-09-13 NOTE — Progress Notes (Signed)
 Identified pt with two pt identifiers(name and DOB). Reviewed record in preparation for visit and have obtained necessary documentation.  Chief Complaint   Patient presents with   . Well Woman        Vitals:    09/13/20 1428   BP: 118/73   Pulse: 90   Temp: 97.6 F (36.4 C)   TempSrc: Temporal   SpO2: 98%   Weight: 161 lb (73 kg)   Height: 5' 4 (1.626 m)   PainSc:   0 - No pain   LMP: 08/06/2020       Health Maintenance Due   Topic   . Hepatitis C Screening    . Colorectal Cancer Screening Combo    . Cervical cancer screen    . Shingrix Vaccine Age 108> (1 of 2)       Coordination of Care Questionnaire:  :   1) Have you been to an emergency room, urgent care, or hospitalized since your last visit?  If yes, where when, and reason for visit? No      2. Have seen or consulted any other health care provider since your last visit?   If yes, where when, and reason for visit?  No

## 2020-09-17 LAB — C.TRACHOMATIS N.GONORRHOEAE DNA
Chlamydia trachomatis, NAA: NEGATIVE
Neisseria Gonorrhoeae, NAA: NEGATIVE

## 2020-09-17 LAB — CHLAMYDIA/NEISSERIA AMPLIFICATION
Chlamydia amplification: NEGATIVE
N. gonorrhoeae amplification: NEGATIVE

## 2020-10-05 DIAGNOSIS — M9903 Segmental and somatic dysfunction of lumbar region: Secondary | ICD-10-CM | POA: Diagnosis not present

## 2020-10-05 DIAGNOSIS — M9904 Segmental and somatic dysfunction of sacral region: Secondary | ICD-10-CM | POA: Diagnosis not present

## 2020-10-05 DIAGNOSIS — M5386 Other specified dorsopathies, lumbar region: Secondary | ICD-10-CM | POA: Diagnosis not present

## 2020-10-05 DIAGNOSIS — M531 Cervicobrachial syndrome: Secondary | ICD-10-CM | POA: Diagnosis not present

## 2020-10-22 ENCOUNTER — Encounter

## 2020-10-23 MED ORDER — ALPRAZOLAM 0.5 MG TAB
0.5 mg | ORAL_TABLET | Freq: Three times a day (TID) | ORAL | 0 refills | Status: AC | PRN
Start: 2020-10-23 — End: ?

## 2020-11-12 DIAGNOSIS — M531 Cervicobrachial syndrome: Secondary | ICD-10-CM | POA: Diagnosis not present

## 2020-11-12 DIAGNOSIS — M9904 Segmental and somatic dysfunction of sacral region: Secondary | ICD-10-CM | POA: Diagnosis not present

## 2020-11-12 DIAGNOSIS — M5386 Other specified dorsopathies, lumbar region: Secondary | ICD-10-CM | POA: Diagnosis not present

## 2020-11-12 DIAGNOSIS — M9903 Segmental and somatic dysfunction of lumbar region: Secondary | ICD-10-CM | POA: Diagnosis not present

## 2021-01-07 ENCOUNTER — Encounter

## 2021-01-07 LAB — COMPREHENSIVE METABOLIC PANEL
ALT: 21 U/L (ref 12–78)
AST: 16 U/L (ref 15–37)
Albumin/Globulin Ratio: 1.3 (ref 1.1–2.2)
Albumin: 4.1 g/dL (ref 3.5–5.0)
Alkaline Phosphatase: 86 U/L (ref 45–117)
Anion Gap: 2 mmol/L — ABNORMAL LOW (ref 5–15)
BUN/Creatinine Ratio: 18 (ref 12–20)
BUN: 16 MG/DL (ref 6–20)
CO2: 29 mmol/L (ref 21–32)
Calcium: 9 MG/DL (ref 8.5–10.1)
Chloride: 106 mmol/L (ref 97–108)
Creatinine: 0.88 MG/DL (ref 0.55–1.02)
GFR African American: >60 mL/min/{1.73_m2} (ref >60)
Globulin: 3.2 g/dL (ref 2.0–4.0)
Glucose: 85 mg/dL (ref 65–100)
Potassium: 4.4 mmol/L (ref 3.5–5.1)
Sodium: 137 mmol/L (ref 136–145)
Total Bilirubin: 0.4 MG/DL (ref 0.2–1.0)
Total Protein: 7.3 g/dL (ref 6.4–8.2)
eGFR NON-AA: >60 mL/min/{1.73_m2} (ref >60)

## 2021-01-07 LAB — CBC
Hematocrit: 40.9 % (ref 35.0–47.0)
Hemoglobin: 13.3 g/dL (ref 11.5–16.0)
MCH: 31 PG (ref 26.0–34.0)
MCHC: 32.5 g/dL (ref 30.0–36.5)
MCV: 95.3 FL (ref 80.0–99.0)
MPV: 9.6 FL (ref 8.9–12.9)
NRBC Absolute: 0 10*3/uL (ref 0.00–0.01)
Nucleated RBCs: 0 PER 100 WBC
Platelets: 292 10*3/uL (ref 150–400)
RBC: 4.29 M/uL (ref 3.80–5.20)
RDW: 13 % (ref 11.5–14.5)
WBC: 3.2 10*3/uL — ABNORMAL LOW (ref 3.6–11.0)

## 2021-01-07 LAB — LIPID PANEL
CHOL/HDL Ratio: 2.1 (ref 0.0–5.0)
Chol/HDL Ratio: 2.1 (ref 0.0–5.0)
Cholesterol, Total: 190 MG/DL (ref <200)
Cholesterol, total: 190 MG/DL (ref <200)
HDL Cholesterol: 91 MG/DL
HDL: 91 MG/DL
LDL Calculated: 89.8 MG/DL (ref 0–100)
LDL, calculated: 89.8 MG/DL (ref 0–100)
Triglyceride: 46 MG/DL (ref <150)
Triglycerides: 46 MG/DL (ref <150)
VLDL Cholesterol Calculated: 9.2 MG/DL
VLDL, calculated: 9.2 MG/DL

## 2021-01-07 LAB — HEPATITIS C ANTIBODY: Hepatitis C Ab: NONREACTIVE

## 2021-01-07 LAB — METABOLIC PANEL, COMPREHENSIVE
A-G Ratio: 1.3 (ref 1.1–2.2)
ALT (SGPT): 21 U/L (ref 12–78)
AST (SGOT): 16 U/L (ref 15–37)
Albumin: 4.1 g/dL (ref 3.5–5.0)
Alk. phosphatase: 86 U/L (ref 45–117)
Anion gap: 2 mmol/L — ABNORMAL LOW (ref 5–15)
BUN/Creatinine ratio: 18 (ref 12–20)
BUN: 16 MG/DL (ref 6–20)
Bilirubin, total: 0.4 MG/DL (ref 0.2–1.0)
CO2: 29 mmol/L (ref 21–32)
Calcium: 9 MG/DL (ref 8.5–10.1)
Chloride: 106 mmol/L (ref 97–108)
Creatinine: 0.88 MG/DL (ref 0.55–1.02)
GFR est AA: >60 mL/min/{1.73_m2} (ref >60)
GFR est non-AA: >60 mL/min/{1.73_m2} (ref >60)
Globulin: 3.2 g/dL (ref 2.0–4.0)
Glucose: 85 mg/dL (ref 65–100)
Potassium: 4.4 mmol/L (ref 3.5–5.1)
Protein, total: 7.3 g/dL (ref 6.4–8.2)
Sodium: 137 mmol/L (ref 136–145)

## 2021-01-07 LAB — CBC W/O DIFF
ABSOLUTE NRBC: 0 10*3/uL (ref 0.00–0.01)
HCT: 40.9 % (ref 35.0–47.0)
HGB: 13.3 g/dL (ref 11.5–16.0)
MCH: 31 PG (ref 26.0–34.0)
MCHC: 32.5 g/dL (ref 30.0–36.5)
MCV: 95.3 FL (ref 80.0–99.0)
MPV: 9.6 FL (ref 8.9–12.9)
NRBC: 0 PER 100 WBC
PLATELET: 292 10*3/uL (ref 150–400)
RBC: 4.29 M/uL (ref 3.80–5.20)
RDW: 13 % (ref 11.5–14.5)
WBC: 3.2 10*3/uL — ABNORMAL LOW (ref 3.6–11.0)

## 2021-01-07 LAB — HEPATITIS C AB: Hep C virus Ab Interp.: NONREACTIVE

## 2021-01-15 DIAGNOSIS — M5386 Other specified dorsopathies, lumbar region: Secondary | ICD-10-CM | POA: Diagnosis not present

## 2021-01-15 DIAGNOSIS — M531 Cervicobrachial syndrome: Secondary | ICD-10-CM | POA: Diagnosis not present

## 2021-01-15 DIAGNOSIS — M9903 Segmental and somatic dysfunction of lumbar region: Secondary | ICD-10-CM | POA: Diagnosis not present

## 2021-01-15 DIAGNOSIS — M9904 Segmental and somatic dysfunction of sacral region: Secondary | ICD-10-CM | POA: Diagnosis not present

## 2021-01-25 DIAGNOSIS — Z1231 Encounter for screening mammogram for malignant neoplasm of breast: Secondary | ICD-10-CM | POA: Diagnosis not present

## 2021-01-25 LAB — HM MAMMOGRAPHY
Mammography, External: NORMAL
Mammography, External: NORMAL

## 2021-01-27 DIAGNOSIS — W5511XA Bitten by horse, initial encounter: Secondary | ICD-10-CM

## 2021-01-27 HISTORY — DX: Bitten by horse, initial encounter: W55.11XA

## 2021-01-29 DIAGNOSIS — M9904 Segmental and somatic dysfunction of sacral region: Secondary | ICD-10-CM | POA: Diagnosis not present

## 2021-01-29 DIAGNOSIS — M5386 Other specified dorsopathies, lumbar region: Secondary | ICD-10-CM | POA: Diagnosis not present

## 2021-01-29 DIAGNOSIS — M531 Cervicobrachial syndrome: Secondary | ICD-10-CM | POA: Diagnosis not present

## 2021-01-29 DIAGNOSIS — M9903 Segmental and somatic dysfunction of lumbar region: Secondary | ICD-10-CM | POA: Diagnosis not present

## 2021-02-04 NOTE — Telephone Encounter (Signed)
LVM for pt to call to schedule a consultation appointment for Sleep Apnea.

## 2021-02-19 DIAGNOSIS — M531 Cervicobrachial syndrome: Secondary | ICD-10-CM | POA: Diagnosis not present

## 2021-02-19 DIAGNOSIS — M9904 Segmental and somatic dysfunction of sacral region: Secondary | ICD-10-CM | POA: Diagnosis not present

## 2021-02-19 DIAGNOSIS — M5386 Other specified dorsopathies, lumbar region: Secondary | ICD-10-CM | POA: Diagnosis not present

## 2021-02-19 DIAGNOSIS — M9903 Segmental and somatic dysfunction of lumbar region: Secondary | ICD-10-CM | POA: Diagnosis not present

## 2021-03-05 DIAGNOSIS — M5386 Other specified dorsopathies, lumbar region: Secondary | ICD-10-CM | POA: Diagnosis not present

## 2021-03-05 DIAGNOSIS — M9904 Segmental and somatic dysfunction of sacral region: Secondary | ICD-10-CM | POA: Diagnosis not present

## 2021-03-05 DIAGNOSIS — M531 Cervicobrachial syndrome: Secondary | ICD-10-CM | POA: Diagnosis not present

## 2021-03-05 DIAGNOSIS — M9903 Segmental and somatic dysfunction of lumbar region: Secondary | ICD-10-CM | POA: Diagnosis not present

## 2021-03-06 ENCOUNTER — Encounter

## 2021-03-06 NOTE — Telephone Encounter (Signed)
Last PDMP Loraine Leriche as Reviewed:  Review User Review Instant Review Result   Ciarra Braddy K 03/06/2021  5:20 PM Reviewed PDMP [1]     Cannot send controlled substances out of state. Please notify patient

## 2021-03-07 NOTE — Telephone Encounter (Signed)
Called pt, and left a voice message, advising pt that we are unable to send requested medication to a different state.   Advised to call office if questions or concerns.

## 2021-03-19 DIAGNOSIS — M5386 Other specified dorsopathies, lumbar region: Secondary | ICD-10-CM | POA: Diagnosis not present

## 2021-03-19 DIAGNOSIS — M9904 Segmental and somatic dysfunction of sacral region: Secondary | ICD-10-CM | POA: Diagnosis not present

## 2021-03-19 DIAGNOSIS — M9903 Segmental and somatic dysfunction of lumbar region: Secondary | ICD-10-CM | POA: Diagnosis not present

## 2021-03-19 DIAGNOSIS — M531 Cervicobrachial syndrome: Secondary | ICD-10-CM | POA: Diagnosis not present

## 2021-03-26 DIAGNOSIS — S86312A Strain of muscle(s) and tendon(s) of peroneal muscle group at lower leg level, left leg, initial encounter: Secondary | ICD-10-CM | POA: Diagnosis not present

## 2021-03-26 DIAGNOSIS — M25572 Pain in left ankle and joints of left foot: Secondary | ICD-10-CM | POA: Diagnosis not present

## 2021-04-16 DIAGNOSIS — M5386 Other specified dorsopathies, lumbar region: Secondary | ICD-10-CM | POA: Diagnosis not present

## 2021-04-16 DIAGNOSIS — M531 Cervicobrachial syndrome: Secondary | ICD-10-CM | POA: Diagnosis not present

## 2021-04-16 DIAGNOSIS — M9904 Segmental and somatic dysfunction of sacral region: Secondary | ICD-10-CM | POA: Diagnosis not present

## 2021-04-16 DIAGNOSIS — M9903 Segmental and somatic dysfunction of lumbar region: Secondary | ICD-10-CM | POA: Diagnosis not present

## 2021-04-17 DIAGNOSIS — S86312D Strain of muscle(s) and tendon(s) of peroneal muscle group at lower leg level, left leg, subsequent encounter: Secondary | ICD-10-CM | POA: Diagnosis not present

## 2021-04-19 DIAGNOSIS — Z01419 Encounter for gynecological examination (general) (routine) without abnormal findings: Secondary | ICD-10-CM | POA: Diagnosis not present

## 2021-04-19 DIAGNOSIS — N841 Polyp of cervix uteri: Secondary | ICD-10-CM | POA: Diagnosis not present

## 2021-04-19 DIAGNOSIS — Z124 Encounter for screening for malignant neoplasm of cervix: Secondary | ICD-10-CM | POA: Diagnosis not present

## 2021-05-10 DIAGNOSIS — M9903 Segmental and somatic dysfunction of lumbar region: Secondary | ICD-10-CM | POA: Diagnosis not present

## 2021-05-10 DIAGNOSIS — M9904 Segmental and somatic dysfunction of sacral region: Secondary | ICD-10-CM | POA: Diagnosis not present

## 2021-05-10 DIAGNOSIS — M531 Cervicobrachial syndrome: Secondary | ICD-10-CM | POA: Diagnosis not present

## 2021-05-10 DIAGNOSIS — M5386 Other specified dorsopathies, lumbar region: Secondary | ICD-10-CM | POA: Diagnosis not present

## 2021-05-22 DIAGNOSIS — M5386 Other specified dorsopathies, lumbar region: Secondary | ICD-10-CM | POA: Diagnosis not present

## 2021-05-22 DIAGNOSIS — M9904 Segmental and somatic dysfunction of sacral region: Secondary | ICD-10-CM | POA: Diagnosis not present

## 2021-05-22 DIAGNOSIS — M9903 Segmental and somatic dysfunction of lumbar region: Secondary | ICD-10-CM | POA: Diagnosis not present

## 2021-05-22 DIAGNOSIS — M531 Cervicobrachial syndrome: Secondary | ICD-10-CM | POA: Diagnosis not present

## 2021-05-23 DIAGNOSIS — M79672 Pain in left foot: Secondary | ICD-10-CM | POA: Diagnosis not present

## 2021-06-04 DIAGNOSIS — M9903 Segmental and somatic dysfunction of lumbar region: Secondary | ICD-10-CM | POA: Diagnosis not present

## 2021-06-04 DIAGNOSIS — M531 Cervicobrachial syndrome: Secondary | ICD-10-CM | POA: Diagnosis not present

## 2021-06-04 DIAGNOSIS — M9904 Segmental and somatic dysfunction of sacral region: Secondary | ICD-10-CM | POA: Diagnosis not present

## 2021-06-04 DIAGNOSIS — M5386 Other specified dorsopathies, lumbar region: Secondary | ICD-10-CM | POA: Diagnosis not present

## 2021-06-11 DIAGNOSIS — T7840XA Allergy, unspecified, initial encounter: Secondary | ICD-10-CM | POA: Insufficient documentation

## 2021-06-11 DIAGNOSIS — F419 Anxiety disorder, unspecified: Secondary | ICD-10-CM | POA: Insufficient documentation

## 2021-06-19 ENCOUNTER — Ambulatory Visit: Payer: Managed Care, Other (non HMO) | Admitting: Family Medicine

## 2021-06-19 ENCOUNTER — Ambulatory Visit (INDEPENDENT_AMBULATORY_CARE_PROVIDER_SITE_OTHER): Payer: BC Managed Care – PPO | Admitting: Podiatry

## 2021-06-19 ENCOUNTER — Encounter: Payer: Self-pay | Admitting: Podiatry

## 2021-06-19 ENCOUNTER — Other Ambulatory Visit: Payer: Self-pay

## 2021-06-19 ENCOUNTER — Ambulatory Visit (INDEPENDENT_AMBULATORY_CARE_PROVIDER_SITE_OTHER): Payer: BC Managed Care – PPO

## 2021-06-19 DIAGNOSIS — M21619 Bunion of unspecified foot: Secondary | ICD-10-CM

## 2021-06-19 DIAGNOSIS — S99922A Unspecified injury of left foot, initial encounter: Secondary | ICD-10-CM | POA: Diagnosis not present

## 2021-06-19 DIAGNOSIS — M7672 Peroneal tendinitis, left leg: Secondary | ICD-10-CM

## 2021-06-19 MED ORDER — TRIAMCINOLONE ACETONIDE 10 MG/ML IJ SUSP
10.0000 mg | Freq: Once | INTRAMUSCULAR | Status: AC
  Administered 2021-06-19: 10 mg

## 2021-06-19 NOTE — Progress Notes (Signed)
Subjective:   Patient ID: Tina Hood, female   DOB: 50 y.o.   MRN: [id number]   HPI Patient presents with a lot of pain in the outside of her left foot stating she hurt it originally 2 years ago and then hurt it again several weeks ago and its been sore for several months.  Patient states does not remember specifics and does not smoke likes to be active   Review of Systems  All other systems reviewed and are negative.      Objective:  Physical Exam Vitals and nursing note reviewed.  Constitutional:      Appearance: She is well-developed.  Pulmonary:     Effort: Pulmonary effort is normal.  Musculoskeletal:        General: Normal range of motion.  Skin:    General: Skin is warm.  Neurological:     Mental Status: She is alert.    Neurovascular status found to be intact muscle strength adequate range of motion within normal limits with patient found to have exquisite discomfort with inflammation fluid around the fifth metatarsal base left at the insertion of the peroneal tendon into the base of the fifth metatarsal bone.  I did not note any muscle dysfunction or other pathology and patient does have good digital perfusion well oriented x3     Assessment:  Appears to be peroneal tendinitis which may be due to injury or just an inflammatory condition with compensatory fluid     Plan:  H&P x-rays reviewed and at this point did sterile prep and injected the sheath of the peroneal tendon as it inserts into the base of the fifth metatarsal 3 mg Dexasone Kenalog 5 mg Xylocaine and applied ice and recommended topical diclofenac and the utilization of brace which was dispensed today.  Reappoint to recheck  X-rays were negative for signs of fracture base of fifth metatarsal right or other bony pathology

## 2021-06-19 NOTE — Patient Instructions (Signed)

## 2021-06-20 ENCOUNTER — Encounter: Payer: Self-pay | Admitting: Family Medicine

## 2021-06-24 ENCOUNTER — Encounter: Payer: Self-pay | Admitting: Family Medicine

## 2021-06-24 ENCOUNTER — Ambulatory Visit: Payer: BC Managed Care – PPO | Admitting: Family Medicine

## 2021-06-24 ENCOUNTER — Other Ambulatory Visit: Payer: Self-pay

## 2021-06-24 VITALS — BP 112/72 | HR 89 | Temp 98.6°F | Ht 63.5 in | Wt 167.0 lb

## 2021-06-24 DIAGNOSIS — F410 Panic disorder [episodic paroxysmal anxiety] without agoraphobia: Secondary | ICD-10-CM

## 2021-06-24 DIAGNOSIS — Z7689 Persons encountering health services in other specified circumstances: Secondary | ICD-10-CM

## 2021-06-24 DIAGNOSIS — Z1211 Encounter for screening for malignant neoplasm of colon: Secondary | ICD-10-CM

## 2021-06-24 DIAGNOSIS — F419 Anxiety disorder, unspecified: Secondary | ICD-10-CM

## 2021-06-24 DIAGNOSIS — Z23 Encounter for immunization: Secondary | ICD-10-CM | POA: Diagnosis not present

## 2021-06-24 MED ORDER — ALPRAZOLAM 0.5 MG PO TABS: 0.5000 mg | ORAL_TABLET | Freq: Every day | ORAL | 1 refills | Status: DC | PRN

## 2021-06-24 MED ORDER — ESCITALOPRAM OXALATE 10 MG PO TABS: 5.0000 mg | ORAL_TABLET | Freq: Every day | ORAL | 0 refills | Status: DC

## 2021-06-24 NOTE — Patient Instructions (Addendum)
Great to see you today.  I have refilled the medication(s) we provide.   If labs were collected, we will inform you of lab results once received either by echart message or telephone call.   - echart message- for normal results that have been seen by the patient already.   - telephone call: abnormal results or if patient has not viewed results in their echart.   Start lexapro once daily. Xanax 0.5 mg as needed prescribed.   Follow up in November.

## 2021-06-24 NOTE — Progress Notes (Signed)
Patient ID: Tina Hood, female  DOB: 11/07/1970, 50 y.o.   MRN: [id number] Patient Care Team    Relationship Specialty Notifications Start End  Natalia Leatherwood, DO PCP - General Family Medicine  06/19/21   Melina Fiddler, MD Consulting Physician Sports Medicine  06/24/21   Marlow Baars, MD Consulting Physician Obstetrics  06/24/21    Comment: [facility]  Lenn Sink, [place] Consulting Physician Podiatry  06/24/21     Chief Complaint  Patient presents with   Establish Care   Follow-up    [facility]   Panic Attack    Subjective: Tina Hood is a 50 y.o.  female present for new patient establishment/cmc  All past medical history, surgical history, allergies, family history, immunizations, medications and social history were updated in the electronic medical record today. All recent labs, ED visits and hospitalizations within the last year were reviewed.  Anxiety/panic: Patient reports she has had anxiety and panic attacks since she was in high school.  She has been prescribed Xanax 0.5 mg daily as needed for panic for many years.  She states she uses this medication infrequently and goes through about 60 tabs a year.  She has recently moved back to the area with her family and has noticed more stress in her life.  She is open to try daily medication.  Patient has never had a colonoscopy.  He had been referred to gastroenterology just prior to her moving, but was unable to establish secondary to relocating.    Depression screen [facility] 2/9 06/24/2021  Decreased Interest 1  Down, Depressed, Hopeless 3  PHQ - 2 Score 4  Altered sleeping 0  Tired, decreased energy 0  Change in appetite 1  Feeling bad or failure about yourself  1  Trouble concentrating 0  Moving slowly or fidgety/restless 0  Suicidal thoughts 0  PHQ-9 Score 6   GAD 7 : Generalized Anxiety Score 06/24/2021  Nervous, Anxious, on Edge 2  Control/stop worrying 2  Worry too much - different things 2   Trouble relaxing 1  Restless 1  Easily annoyed or irritable 1  Afraid - awful might happen 1  Total GAD 7 Score 10        No flowsheet data found.   Immunization History  Administered Date(s) Administered   Influenza Split 08/08/2009, 08/03/2010, 08/04/2012, 08/03/2013, 07/12/2020   Influenza,inj,Quad PF,6+ Mos 09/07/2014, 08/22/2015, 09/23/2016   PFIZER(Purple Top)SARS-COV-2 Vaccination 01/27/2020, 02/17/2020, 10/06/2020   Tdap 10/04/2003, 11/22/2013   Zoster Recombinat (Shingrix) 06/24/2021    No results found.  Past Medical History:  Diagnosis Date   Anxiety    Arthritis    Horse bite 01/27/2021   followed by gyn   Migraines    Allergies  Allergen Reactions   Bee Venom Anaphylaxis   Daypro [Oxaprozin] Rash   Lodine [Etodolac] Rash   Past Surgical History:  Procedure Laterality Date   CYSTECTOMY     GANGLION CYST EXCISION     Family History  Problem Relation Age of Onset   Hyperlipidemia Mother    Hypertension Mother    Arthritis Mother    Skin cancer Mother    Hearing loss Mother    Atrial fibrillation Mother    Hypertension Father    Hyperlipidemia Father    Hearing loss Father    Arthritis Father    Prostate cancer Father    Asthma Sister    Arthritis Sister    Miscarriages / [place] Sister  Miscarriages / Stillbirths Sister    Arthritis Sister    Hearing loss Maternal Grandmother    Arthritis Maternal Grandmother    Early death Paternal Grandmother    Alcohol abuse Paternal Grandfather    Liver cancer Paternal Grandfather    Social History   Social History Narrative   Marital status/children/pets: married, G2P2   Education/employment: B.A.   Safety:      -smoke alarm in the home:Yes     - wears seatbelt: Yes     - Feels safe in their relationships: Yes       Allergies as of 06/24/2021       Reactions   Bee Venom Anaphylaxis   Daypro [oxaprozin] Rash   Lodine [etodolac] Rash        Medication List        Accurate  as of June 24, 2021  5:02 PM. If you have any questions, ask your nurse or doctor.          ALPRAZolam 0.5 MG tablet Commonly known as: XANAX Take 1 tablet (0.5 mg total) by mouth daily as needed for anxiety. What changed:  medication strength how much to take when to take this reasons to take this Another medication with the same name was removed. Continue taking this medication, and follow the directions you see here. Changed by: Felix Pacini, DO   BENADRYL ALLERGY PO Take by mouth.   cetirizine-pseudoephedrine 5-120 MG tablet Commonly known as: ZYRTEC-D Take by mouth.   Daily Vites tablet Take 1 tablet by mouth daily.   escitalopram 10 MG tablet Commonly known as: Lexapro Take 0.5-1 tablets (5-10 mg total) by mouth daily. Started by: Felix Pacini, DO   HM 4X Probiotic Tabs Take by mouth.        All past medical history, surgical history, allergies, family history, immunizations andmedications were updated in the EMR today and reviewed under the history and medication portions of their EMR.    No results found for this or any previous visit (from the past 2160 hour(s)).  No results found.   ROS: 14 pt review of systems performed and negative (unless mentioned in an HPI)  Objective: BP 112/72   Pulse 89   Temp 98.6 F (37 C) (Oral)   Ht 5' 3.5" (1.613 m)   Wt 167 lb (75.8 kg)   LMP 04/20/2021   SpO2 98%   BMI 29.12 kg/m  Gen: Afebrile. No acute distress. Nontoxic in appearance, well-developed, well-nourished,  very pleasant female.  HENT: AT. [place].  no Cough on exam, no hoarseness on exam. Eyes:Pupils Equal Round Reactive to light, Extraocular movements intact,  Conjunctiva without redness, discharge or icterus. CV: RRR no murmur, no edema Chest: CTAB, no wheeze, rhonchi or crackles.  Neuro/Msk:Normal gait. PERLA. EOMi. Alert. Oriented x3.   Psych: Normal affect, dress and demeanor. Normal speech. Normal thought content and  judgment.  Assessment/plan: Tina Hood is a 50 y.o. female present for est/cmc Establishing care with new doctor, encounter for Anxiety/Panic disorder (episodic paroxysmal anxiety) Discussed different options with her today for treatment plan. Patient declining referral to therapist, at least for now. Continue Xanax 0.5 mg daily as needed-face-to-face visit needed for any refills. Start Lexapro 5 5 mg daily.  May taper to Lexapro 10 mg after 2 weeks if needed. Follow-up in 2-1/2 months for CPE/CMC.  Screen for colon cancer - Ambulatory referral to Gastroenterology Need for zoster vaccination - Varicella-zoster vaccine IM #1 - 2nd shingrix at cpe  Return in about 12 weeks (around 09/16/2021) for cpe/cmc/shingrix #2.  Orders Placed This Encounter  Procedures   Varicella-zoster vaccine IM   Ambulatory referral to Gastroenterology    Meds ordered this encounter  Medications   escitalopram (LEXAPRO) 10 MG tablet    Sig: Take 0.5-1 tablets (5-10 mg total) by mouth daily.    Dispense:  90 tablet    Refill:  0   ALPRAZolam (XANAX) 0.5 MG tablet    Sig: Take 1 tablet (0.5 mg total) by mouth daily as needed for anxiety.    Dispense:  90 tablet    Refill:  1    Referral Orders         Ambulatory referral to Gastroenterology       Note is dictated utilizing voice recognition software. Although note has been proof read prior to signing, occasional typographical errors still can be missed. If any questions arise, please do not hesitate to call for verification.  Electronically signed by: Felix Pacini, DO [facility] [place]

## 2021-07-01 ENCOUNTER — Encounter: Payer: Self-pay | Admitting: Family Medicine

## 2021-07-03 ENCOUNTER — Other Ambulatory Visit: Payer: Self-pay

## 2021-07-03 ENCOUNTER — Ambulatory Visit: Payer: BC Managed Care – PPO | Admitting: Podiatry

## 2021-07-03 ENCOUNTER — Encounter: Payer: Self-pay | Admitting: Podiatry

## 2021-07-03 DIAGNOSIS — M7672 Peroneal tendinitis, left leg: Secondary | ICD-10-CM | POA: Diagnosis not present

## 2021-07-03 NOTE — Progress Notes (Signed)
Subjective:   Patient ID: Tina Hood, female   DOB: 50 y.o.   MRN: [id number]   HPI Patient states quite a bit improved with a significant diminishment of the discomfort on the outside of the left foot   ROS      Objective:  Physical Exam  Neurovascular status intact significant diminishment of the discomfort on the lateral side of the left foot with minimal swelling noted     Assessment:  Peroneal tendinitis improving left mild discomfort still noted     Plan:  H&P reviewed condition and advised on ice therapy continued shoe gear and patient will be seen back as needed

## 2021-07-05 DIAGNOSIS — M531 Cervicobrachial syndrome: Secondary | ICD-10-CM | POA: Diagnosis not present

## 2021-07-05 DIAGNOSIS — M9903 Segmental and somatic dysfunction of lumbar region: Secondary | ICD-10-CM | POA: Diagnosis not present

## 2021-07-05 DIAGNOSIS — M5386 Other specified dorsopathies, lumbar region: Secondary | ICD-10-CM | POA: Diagnosis not present

## 2021-07-05 DIAGNOSIS — M9904 Segmental and somatic dysfunction of sacral region: Secondary | ICD-10-CM | POA: Diagnosis not present

## 2021-07-31 DIAGNOSIS — M5386 Other specified dorsopathies, lumbar region: Secondary | ICD-10-CM | POA: Diagnosis not present

## 2021-07-31 DIAGNOSIS — M531 Cervicobrachial syndrome: Secondary | ICD-10-CM | POA: Diagnosis not present

## 2021-07-31 DIAGNOSIS — M9903 Segmental and somatic dysfunction of lumbar region: Secondary | ICD-10-CM | POA: Diagnosis not present

## 2021-07-31 DIAGNOSIS — M9904 Segmental and somatic dysfunction of sacral region: Secondary | ICD-10-CM | POA: Diagnosis not present

## 2021-08-28 DIAGNOSIS — M531 Cervicobrachial syndrome: Secondary | ICD-10-CM | POA: Diagnosis not present

## 2021-08-28 DIAGNOSIS — M9903 Segmental and somatic dysfunction of lumbar region: Secondary | ICD-10-CM | POA: Diagnosis not present

## 2021-08-28 DIAGNOSIS — M9904 Segmental and somatic dysfunction of sacral region: Secondary | ICD-10-CM | POA: Diagnosis not present

## 2021-08-28 DIAGNOSIS — M5386 Other specified dorsopathies, lumbar region: Secondary | ICD-10-CM | POA: Diagnosis not present

## 2021-09-17 ENCOUNTER — Encounter: Payer: Self-pay | Admitting: Family Medicine

## 2021-09-17 ENCOUNTER — Other Ambulatory Visit: Payer: Self-pay

## 2021-09-17 ENCOUNTER — Telehealth: Payer: Self-pay | Admitting: Family Medicine

## 2021-09-17 ENCOUNTER — Ambulatory Visit (INDEPENDENT_AMBULATORY_CARE_PROVIDER_SITE_OTHER): Payer: BC Managed Care – PPO | Admitting: Family Medicine

## 2021-09-17 ENCOUNTER — Encounter: Attending: Family Medicine | Primary: Family Medicine

## 2021-09-17 VITALS — BP 103/70 | HR 75 | Temp 98.7°F | Ht 64.0 in | Wt 167.0 lb

## 2021-09-17 DIAGNOSIS — F419 Anxiety disorder, unspecified: Secondary | ICD-10-CM

## 2021-09-17 DIAGNOSIS — D72819 Decreased white blood cell count, unspecified: Secondary | ICD-10-CM | POA: Insufficient documentation

## 2021-09-17 DIAGNOSIS — Z23 Encounter for immunization: Secondary | ICD-10-CM

## 2021-09-17 DIAGNOSIS — F410 Panic disorder [episodic paroxysmal anxiety] without agoraphobia: Secondary | ICD-10-CM

## 2021-09-17 DIAGNOSIS — Z0000 Encounter for general adult medical examination without abnormal findings: Secondary | ICD-10-CM | POA: Diagnosis not present

## 2021-09-17 DIAGNOSIS — Z130 Encounter for screening for diseases of the blood and blood-forming organs and certain disorders involving the immune mechanism: Secondary | ICD-10-CM

## 2021-09-17 DIAGNOSIS — E663 Overweight: Secondary | ICD-10-CM | POA: Diagnosis not present

## 2021-09-17 DIAGNOSIS — Z131 Encounter for screening for diabetes mellitus: Secondary | ICD-10-CM | POA: Diagnosis not present

## 2021-09-17 LAB — COMPREHENSIVE METABOLIC PANEL
ALT: 13 U/L (ref 0–35)
AST: 19 U/L (ref 0–37)
Albumin: 4.8 g/dL (ref 3.5–5.2)
Alkaline Phosphatase: 77 U/L (ref 39–117)
BUN: 16 mg/dL (ref 6–23)
CO2: 30 mEq/L (ref 19–32)
Calcium: 9.3 mg/dL (ref 8.4–10.5)
Chloride: 100 mEq/L (ref 96–112)
Creatinine, Ser: 0.82 mg/dL (ref 0.40–1.20)
GFR: 82.62 mL/min (ref >60.00)
Glucose, Bld: 82 mg/dL (ref 70–99)
Potassium: 4.2 mEq/L (ref 3.5–5.1)
Sodium: 137 mEq/L (ref 135–145)
Total Bilirubin: 0.5 mg/dL (ref 0.2–1.2)
Total Protein: 7.2 g/dL (ref 6.0–8.3)

## 2021-09-17 LAB — CBC
HCT: 40.3 % (ref 36.0–46.0)
Hemoglobin: 13.4 g/dL (ref 12.0–15.0)
MCHC: 33.1 g/dL (ref 30.0–36.0)
MCV: 92.8 fl (ref 78.0–100.0)
Platelets: 253 10*3/uL (ref 150.0–400.0)
RBC: 4.34 Mil/uL (ref 3.87–5.11)
RDW: 13.3 % (ref 11.5–15.5)
WBC: 2.9 10*3/uL — ABNORMAL LOW (ref 4.0–10.5)

## 2021-09-17 LAB — LIPID PANEL
Cholesterol: 184 mg/dL (ref 0–200)
HDL: 97 mg/dL (ref >39.00)
LDL Cholesterol: 80 mg/dL (ref 0–99)
NonHDL: 87.18
Total CHOL/HDL Ratio: 2
Triglycerides: 36 mg/dL (ref 0.0–149.0)
VLDL: 7.2 mg/dL (ref 0.0–40.0)

## 2021-09-17 LAB — HEMOGLOBIN A1C: Hgb A1c MFr Bld: 5.3 % (ref 4.6–6.5)

## 2021-09-17 LAB — TSH: TSH: 1.16 u[IU]/mL (ref 0.35–5.50)

## 2021-09-17 MED ORDER — ESCITALOPRAM OXALATE 10 MG PO TABS: 10.0000 mg | ORAL_TABLET | Freq: Every day | ORAL | 1 refills | Status: DC

## 2021-09-17 NOTE — Telephone Encounter (Signed)
Please call patient Tina Hood, Tina Hood function are normal electrolytes are normal.   Diabetes screening/A1c is normal  Cholesterol panel looks absolutely great and at goal for her. Her blood cell counts are normal with the exception of her total white blood cell count is low.  I reviewed records from March and care everywhere tab and it was 3.2 then and 2.9 now.  In 2020, they appeared normal.  I would like to repeat this lab in 4 weeks, along with more specialized labs to look at each cell types more specifically to be thorough.  I have placed the lab orders for her. -Please schedule her for lab appt and then provider follow-up 2-3 days after so that we can discuss all labs in detail. (Must schedule both appts)

## 2021-09-17 NOTE — Progress Notes (Signed)
This visit occurred during the SARS-CoV-2 public health emergency.  Safety protocols were in place, including screening questions prior to the visit, additional usage of staff PPE, and extensive cleaning of exam room while observing appropriate contact time as indicated for disinfecting solutions.    Patient ID: Tina Hood, female  DOB: 1971/04/29, 50 y.o.   MRN: [id number] Patient Care Team    Relationship Specialty Notifications Start End  Natalia Leatherwood, DO PCP - General Family Medicine  06/19/21   Melina Fiddler, MD Consulting Physician Sports Medicine  06/24/21   Marlow Baars, MD Consulting Physician Obstetrics  06/24/21    Comment: [facility]  Lenn Sink, [place] Consulting Physician Podiatry  06/24/21     Chief Complaint  Patient presents with   Annual Exam    Pt is fasting    Subjective: Tina Hood is a 50 y.o.  Female  present for CPE. All past medical history, surgical history, allergies, family history, immunizations, medications and social history were updated in the electronic medical record today. All recent labs, ED visits and hospitalizations within the last year were reviewed.  Anxiety/panic: Pt reports she has recently tapered up to lexapro 10 mg and feels it is working well. She has only had one mild panic attack since starting. She does not need refills on xanax.   Prior note Patient reports she has had anxiety and panic attacks since she was in high school.  She has been prescribed Xanax 0.5 mg daily as needed for panic for many years.  She states she uses this medication infrequently and goes through about 60 tabs a year.  She has recently moved back to the area with her family and has noticed more stress in her life.  She is open to try daily medication  Health maintenance:  Colonoscopy: scheduled for 12/2021. [facility]: completed:01/2021> [facility] Has scheduled.  Cervical cancer screening: last pap: 09/2020- WNL-neg  HPV Immunizations: tdap UTD 2015, Influenza UTD 2022 (encouraged yearly), shingrix series completed. Covid series completed.  Infectious disease screening: HIV and Hep C completed DEXA: routine screen Assistive device: none Oxygen FBP:[facility] Patient has a Dental home. Hospitalizations/ED visits: reviewed  Depression screen [facility] 2/9 09/17/2021 06/24/2021  Decreased Interest 1 1  Down, Depressed, Hopeless 0 3  PHQ - 2 Score 1 4  Altered sleeping 1 0  Tired, decreased energy 0 0  Change in appetite 0 1  Feeling bad or failure about yourself  0 1  Trouble concentrating 2 0  Moving slowly or fidgety/restless 0 0  Suicidal thoughts 0 0  PHQ-9 Score 4 6   GAD 7 : Generalized Anxiety Score 09/17/2021 06/24/2021  Nervous, Anxious, on Edge 2 2  Control/stop worrying 2 2  Worry too much - different things 2 2  Trouble relaxing 1 1  Restless 1 1  Easily annoyed or irritable 1 1  Afraid - awful might happen 0 1  Total GAD 7 Score 9 10    Immunization History  Administered Date(s) Administered   Influenza Split 08/08/2009, 08/03/2010, 08/04/2012, 08/03/2013, 07/12/2020   Influenza,inj,Quad PF,6+ Mos 09/07/2014, 08/22/2015, 09/23/2016   Influenza-Unspecified 09/02/2021   PFIZER(Purple Top)SARS-COV-2 Vaccination 01/27/2020, 02/17/2020, 10/06/2020   Tdap 10/04/2003, 11/22/2013   Zoster Recombinat (Shingrix) 06/24/2021, 09/17/2021   Past Medical History:  Diagnosis Date   Anxiety    Arthritis    Horse bite 01/27/2021   followed by gyn   Migraines    Allergies  Allergen Reactions  Bee Venom Anaphylaxis   Daypro [Oxaprozin] Rash   Lodine [Etodolac] Rash   Past Surgical History:  Procedure Laterality Date   CYSTECTOMY     GANGLION CYST EXCISION     Family History  Problem Relation Age of Onset   Hyperlipidemia Mother    Hypertension Mother    Arthritis Mother    Skin cancer Mother    Hearing loss Mother    Atrial fibrillation Mother    Hypertension Father     Hyperlipidemia Father    Hearing loss Father    Arthritis Father    Prostate cancer Father    Asthma Sister    Arthritis Sister    Miscarriages / [place] Sister    Miscarriages / [place] Sister    Arthritis Sister    Hearing loss Maternal Grandmother    Arthritis Maternal Grandmother    Early death Paternal Grandmother    Alcohol abuse Paternal Grandfather    Liver cancer Paternal Grandfather    Social History   Social History Narrative   Marital status/children/pets: married, G2P2   Education/employment: B.A.   Safety:      -smoke alarm in the home:Yes     - wears seatbelt: Yes     - Feels safe in their relationships: Yes       Allergies as of 09/17/2021       Reactions   Bee Venom Anaphylaxis   Daypro [oxaprozin] Rash   Lodine [etodolac] Rash        Medication List        Accurate as of September 17, 2021 10:32 AM. If you have any questions, ask your nurse or doctor.          ALPRAZolam 0.5 MG tablet Commonly known as: XANAX Take 1 tablet (0.5 mg total) by mouth daily as needed for anxiety.   BENADRYL ALLERGY PO Take by mouth.   cetirizine-pseudoephedrine 5-120 MG tablet Commonly known as: ZYRTEC-D Take by mouth.   Daily Vites tablet Take 1 tablet by mouth daily.   escitalopram 10 MG tablet Commonly known as: Lexapro Take 1 tablet (10 mg total) by mouth daily. What changed: how much to take Changed by: Felix Pacini, DO   HM 4X Probiotic Tabs Take by mouth.        All past medical history, surgical history, allergies, family history, immunizations andmedications were updated in the EMR today and reviewed under the history and medication portions of their EMR.     No results found for this or any previous visit (from the past 2160 hour(s)).  No results found.   ROS: 14 pt review of systems performed and negative (unless mentioned in an HPI)  Objective: BP 103/70   Pulse 75   Temp 98.7 F (37.1 C) (Oral)   Ht 5\' 4"  (1.626  m)   Wt 167 lb (75.8 kg)   LMP 06/26/2021   SpO2 100%   BMI 28.67 kg/m  Gen: Afebrile. No acute distress. Nontoxic in appearance, well-developed, well-nourished,  pleasant female.  HENT: AT. [place]. Bilateral TM visualized and normal in appearance, normal external auditory canal. MMM, no oral lesions, adequate dentition. Bilateral nares within normal limits. Throat without erythema, ulcerations or exudates. no Cough on exam, no hoarseness on exam. Eyes:Pupils Equal Round Reactive to light, Extraocular movements intact,  Conjunctiva without redness, discharge or icterus. Neck/lymp/endocrine: Supple,no lymphadenopathy, no thyromegaly CV: RRR no murmur, no edema, +2/4 P posterior tibialis pulses.  Chest: CTAB, no wheeze, rhonchi or crackles. Normal  Respiratory  effort. good Air movement. Abd: Soft. flat. NTND. BS present. no Masses palpated. No hepatosplenomegaly. No rebound tenderness or guarding. Skin: no rashes, purpura or petechiae. Warm and well-perfused. Skin intact. Neuro/Msk:  Normal gait. PERLA. EOMi. Alert. Oriented x3.  Cranial nerves II through XII intact. Muscle strength 5/5 upper/lower extremity. DTRs equal bilaterally. Psych: Normal affect, dress and demeanor. Normal speech. Normal thought content and judgment.   No results found.  Assessment/plan: Tina Hood is a 50 y.o. female present for CPE/cmc Anxiety/Panic disorder (episodic paroxysmal anxiety) Stable.  Patient declining referral to therapist, at least for now. Continue Xanax 0.5 mg daily as needed-face-to-face visit needed for any refills. Continue Lexapro 10 mg  TSH collected today Follow-up in 5.5 mos cmc  Need for vaccination for zoster administered  Overweight (BMI 25.0-29.9) - Lipid panel Screening for deficiency anemia - CBC Diabetes mellitus screening - Comprehensive metabolic panel - Hemoglobin A1c  Routine general medical examination at a health care facility Colonoscopy: scheduled for 12/2021.  [facility]: completed:01/2021> [facility]. Has scheduled.  Cervical cancer screening: last pap: 09/2020- WNL-neg HPV Immunizations: tdap UTD 2015, Influenza UTD 2022 (encouraged yearly), shingrix series completed. Covid series completed.  Infectious disease screening: HIV and Hep C completed DEXA: routine screen Patient was encouraged to exercise greater than 150 minutes a week. Patient was encouraged to choose a diet filled with fresh fruits and vegetables, and lean meats. AVS provided to patient today for education/recommendation on gender specific health and safety maintenance. Return in about 5 months (around 03/03/2022) for CMC (30 min) and 63yr1d cpe.  Orders Placed This Encounter  Procedures   Varicella-zoster vaccine IM   CBC   Comprehensive metabolic panel   Hemoglobin A1c   Lipid panel   TSH   HM PAP SMEAR    Orders Placed This Encounter  Procedures   Varicella-zoster vaccine IM   CBC   Comprehensive metabolic panel   Hemoglobin A1c   Lipid panel   TSH   HM PAP SMEAR   Meds ordered this encounter  Medications   escitalopram (LEXAPRO) 10 MG tablet    Sig: Take 1 tablet (10 mg total) by mouth daily.    Dispense:  90 tablet    Refill:  1    Referral Orders  No referral(s) requested today     Electronically signed by: Felix Pacini, DO [facility] [place]

## 2021-09-17 NOTE — Patient Instructions (Signed)
°Great to see you today.  °I have refilled the medication(s) we provide.  ° °If labs were collected, we will inform you of lab results once received either by echart message or telephone call.  ° - echart message- for normal results that have been seen by the patient already.  ° - telephone call: abnormal results or if patient has not viewed results in their echart. ° °Health Maintenance, Female °Adopting a healthy lifestyle and getting preventive care are important in promoting health and wellness. Ask your health care provider about: °The right schedule for you to have regular tests and exams. °Things you can do on your own to prevent diseases and keep yourself healthy. °What should I know about diet, weight, and exercise? °Eat a healthy diet ° °Eat a diet that includes plenty of vegetables, fruits, low-fat dairy products, and lean protein. °Do not eat a lot of foods that are high in solid fats, added sugars, or sodium. °Maintain a healthy weight °Body mass index (BMI) is used to identify weight problems. It estimates body fat based on height and weight. Your health care provider can help determine your BMI and help you achieve or maintain a healthy weight. °Get regular exercise °Get regular exercise. This is one of the most important things you can do for your health. Most adults should: °Exercise for at least 150 minutes each week. The exercise should increase your heart rate and make you sweat (moderate-intensity exercise). °Do strengthening exercises at least twice a week. This is in addition to the moderate-intensity exercise. °Spend less time sitting. Even light physical activity can be beneficial. °Watch cholesterol and blood lipids °Have your blood tested for lipids and cholesterol at 51 years of age, then have this test every 5 years. °Have your cholesterol levels checked more often if: °Your lipid or cholesterol levels are high. °You are older than 51 years of age. °You are at high risk for heart  disease. °What should I know about cancer screening? °Depending on your health history and family history, you may need to have cancer screening at various ages. This may include screening for: °Breast cancer. °Cervical cancer. °Colorectal cancer. °Skin cancer. °Lung cancer. °What should I know about heart disease, diabetes, and high blood pressure? °Blood pressure and heart disease °High blood pressure causes heart disease and increases the risk of stroke. This is more likely to develop in people who have high blood pressure readings or are overweight. °Have your blood pressure checked: °Every 3-5 years if you are 18-39 years of age. °Every year if you are 40 years old or older. °Diabetes °Have regular diabetes screenings. This checks your fasting blood sugar level. Have the screening done: °Once every three years after age 40 if you are at a normal weight and have a low risk for diabetes. °More often and at a younger age if you are overweight or have a high risk for diabetes. °What should I know about preventing infection? °Hepatitis B °If you have a higher risk for hepatitis B, you should be screened for this virus. Talk with your health care provider to find out if you are at risk for hepatitis B infection. °Hepatitis C °Testing is recommended for: °Everyone born from 1945 through 1965. °Anyone with known risk factors for hepatitis C. °Sexually transmitted infections (STIs) °Get screened for STIs, including gonorrhea and chlamydia, if: °You are sexually active and are younger than 51 years of age. °You are older than 51 years of age and your health care provider   tells you that you are at risk for this type of infection. °Your sexual activity has changed since you were last screened, and you are at increased risk for chlamydia or gonorrhea. Ask your health care provider if you are at risk. °Ask your health care provider about whether you are at high risk for HIV. Your health care provider may recommend a  prescription medicine to help prevent HIV infection. If you choose to take medicine to prevent HIV, you should first get tested for HIV. You should then be tested every 3 months for as long as you are taking the medicine. °Pregnancy °If you are about to stop having your period (premenopausal) and you may become pregnant, seek counseling before you get pregnant. °Take 400 to 800 micrograms (mcg) of folic acid every day if you become pregnant. °Ask for birth control (contraception) if you want to prevent pregnancy. °Osteoporosis and menopause °Osteoporosis is a disease in which the bones lose minerals and strength with aging. This can result in bone fractures. If you are 65 years old or older, or if you are at risk for osteoporosis and fractures, ask your health care provider if you should: °Be screened for bone loss. °Take a calcium or vitamin D supplement to lower your risk of fractures. °Be given hormone replacement therapy (HRT) to treat symptoms of menopause. °Follow these instructions at home: °Alcohol use °Do not drink alcohol if: °Your health care provider tells you not to drink. °You are pregnant, may be pregnant, or are planning to become pregnant. °If you drink alcohol: °Limit how much you have to: °0-1 drink a day. °Know how much alcohol is in your drink. In the [place] one drink equals one 12 oz bottle of beer (355 mL), one 5 oz glass of wine (148 mL), or one 1½ oz glass of hard liquor (44 mL). °Lifestyle °Do not use any products that contain nicotine or tobacco. These products include cigarettes, chewing tobacco, and vaping devices, such as e-cigarettes. If you need help quitting, ask your health care provider. °Do not use street drugs. °Do not share needles. °Ask your health care provider for help if you need support or information about quitting drugs. °General instructions °Schedule regular health, dental, and eye exams. °Stay current with your vaccines. °Tell your health care provider if: °You often  feel depressed. °You have ever been abused or do not feel safe at home. °Summary °Adopting a healthy lifestyle and getting preventive care are important in promoting health and wellness. °Follow your health care provider's instructions about healthy diet, exercising, and getting tested or screened for diseases. °Follow your health care provider's instructions on monitoring your cholesterol and blood pressure. °This information is not intended to replace advice given to you by your health care provider. Make sure you discuss any questions you have with your health care provider. °Document Revised: 03/11/2021 Document Reviewed: 03/11/2021 °Elsevier Patient Education © 2022 [employer] Inc. ° °

## 2021-09-18 DIAGNOSIS — M531 Cervicobrachial syndrome: Secondary | ICD-10-CM | POA: Diagnosis not present

## 2021-09-18 DIAGNOSIS — M9903 Segmental and somatic dysfunction of lumbar region: Secondary | ICD-10-CM | POA: Diagnosis not present

## 2021-09-18 DIAGNOSIS — M9904 Segmental and somatic dysfunction of sacral region: Secondary | ICD-10-CM | POA: Diagnosis not present

## 2021-09-18 DIAGNOSIS — M5386 Other specified dorsopathies, lumbar region: Secondary | ICD-10-CM | POA: Diagnosis not present

## 2021-09-18 NOTE — Telephone Encounter (Signed)
Spoke with pt regarding labs and instructions.   

## 2021-10-03 DIAGNOSIS — M5386 Other specified dorsopathies, lumbar region: Secondary | ICD-10-CM | POA: Diagnosis not present

## 2021-10-03 DIAGNOSIS — M9903 Segmental and somatic dysfunction of lumbar region: Secondary | ICD-10-CM | POA: Diagnosis not present

## 2021-10-03 DIAGNOSIS — M531 Cervicobrachial syndrome: Secondary | ICD-10-CM | POA: Diagnosis not present

## 2021-10-03 DIAGNOSIS — M9904 Segmental and somatic dysfunction of sacral region: Secondary | ICD-10-CM | POA: Diagnosis not present

## 2021-10-15 ENCOUNTER — Other Ambulatory Visit: Payer: Self-pay

## 2021-10-15 ENCOUNTER — Ambulatory Visit (INDEPENDENT_AMBULATORY_CARE_PROVIDER_SITE_OTHER): Payer: BC Managed Care – PPO

## 2021-10-15 DIAGNOSIS — D72819 Decreased white blood cell count, unspecified: Secondary | ICD-10-CM | POA: Diagnosis not present

## 2021-10-16 LAB — CBC WITH DIFFERENTIAL/PLATELET
Absolute Monocytes: 269 cells/uL (ref 200–950)
Basophils Absolute: 38 cells/uL (ref 0–200)
Basophils Relative: 1.2 %
Eosinophils Absolute: 109 cells/uL (ref 15–500)
Eosinophils Relative: 3.4 %
HCT: 41.4 % (ref 35.0–45.0)
Hemoglobin: 13.8 g/dL (ref 11.7–15.5)
Lymphs Abs: 992 cells/uL (ref 850–3900)
MCH: 30.7 pg (ref 27.0–33.0)
MCHC: 33.3 g/dL (ref 32.0–36.0)
MCV: 92.2 fL (ref 80.0–100.0)
MPV: 9.9 fL (ref 7.5–12.5)
Monocytes Relative: 8.4 %
Neutro Abs: 1792 cells/uL (ref 1500–7800)
Neutrophils Relative %: 56 %
Platelets: 271 10*3/uL (ref 140–400)
RBC: 4.49 10*6/uL (ref 3.80–5.10)
RDW: 12.5 % (ref 11.0–15.0)
Total Lymphocyte: 31 %
WBC: 3.2 10*3/uL — ABNORMAL LOW (ref 3.8–10.8)

## 2021-10-16 LAB — PATHOLOGIST SMEAR REVIEW

## 2021-10-18 ENCOUNTER — Other Ambulatory Visit: Payer: Self-pay

## 2021-10-18 ENCOUNTER — Ambulatory Visit (INDEPENDENT_AMBULATORY_CARE_PROVIDER_SITE_OTHER): Payer: BC Managed Care – PPO | Admitting: Family Medicine

## 2021-10-18 ENCOUNTER — Encounter: Payer: Self-pay | Admitting: Family Medicine

## 2021-10-18 VITALS — BP 104/70 | HR 93 | Temp 99.1°F | Ht 64.0 in | Wt 168.0 lb

## 2021-10-18 DIAGNOSIS — R21 Rash and other nonspecific skin eruption: Secondary | ICD-10-CM | POA: Diagnosis not present

## 2021-10-18 DIAGNOSIS — D72819 Decreased white blood cell count, unspecified: Secondary | ICD-10-CM | POA: Diagnosis not present

## 2021-10-18 NOTE — Progress Notes (Signed)
This visit occurred during the SARS-CoV-2 public health emergency.  Safety protocols were in place, including screening questions prior to the visit, additional usage of staff PPE, and extensive cleaning of exam room while observing appropriate contact time as indicated for disinfecting solutions.    Tina Hood , 28-Nov-1970, 50 y.o., female MRN: [id number] Patient Care Team    Relationship Specialty Notifications Start End  Natalia Leatherwood, DO PCP - General Family Medicine  06/19/21   Melina Fiddler, MD Consulting Physician Sports Medicine  06/24/21   Marlow Baars, MD Consulting Physician Obstetrics  06/24/21    Comment: [facility]  Lenn Sink, [place] Consulting Physician Podiatry  06/24/21     Chief Complaint  Patient presents with   leukopenia    Pt is fasting     Subjective: Pt presents for an OV  for follow up on leukopenia. WBC total 2.9 on cpe labs. 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 Pt brings old records with her today which show WBC chronically run mildly low 3.2-4.5. She also states her family members also have the same issue. Since last appt she did start to increase her iron consumption.   Depression screen [facility] 2/9 09/17/2021 06/24/2021  Decreased Interest 1 1  Down, Depressed, Hopeless 0 3  PHQ - 2 Score 1 4  Altered sleeping 1 0  Tired, decreased energy 0 0  Change in appetite 0 1  Feeling bad or failure about yourself  0 1  Trouble concentrating 2 0  Moving slowly or fidgety/restless 0 0  Suicidal thoughts 0 0  PHQ-9 Score 4 6    Allergies  Allergen Reactions   Bee Venom Anaphylaxis   Daypro [Oxaprozin] Rash   Lodine [Etodolac] Rash   Social History   Social History Narrative   Marital status/children/pets: married, G2P2   Education/employment: [employer]   Safety:      -smoke alarm in the home:Yes     - wears seatbelt: Yes     - Feels safe in their  relationships: Yes      Past Medical History:  Diagnosis Date   Anxiety    Arthritis    Horse bite 01/27/2021   followed by gyn   Leukopenia    mild lower WBC chronically - stable   Migraines    Past Surgical History:  Procedure Laterality Date   CYSTECTOMY     GANGLION CYST EXCISION     Family History  Problem Relation Age of Onset   Hyperlipidemia Mother    Hypertension Mother    Arthritis Mother    Skin cancer Mother    Hearing loss Mother    Atrial fibrillation Mother    Hypertension Father    Hyperlipidemia Father    Hearing loss Father    Arthritis Father    Prostate cancer Father    Asthma Sister    Arthritis Sister    Miscarriages / [place] Sister    Miscarriages / [place] Sister    Arthritis Sister    Hearing loss Maternal Grandmother    Arthritis Maternal Grandmother    Early death Paternal Grandmother    Alcohol abuse Paternal Grandfather    Liver cancer Paternal Grandfather    Allergies as of 10/18/2021       Reactions   Bee Venom Anaphylaxis   Daypro [oxaprozin] Rash   Lodine [etodolac] Rash        Medication List  Accurate as of October 18, 2021 10:04 AM. If you have any questions, ask your nurse or doctor.          ALPRAZolam 0.5 MG tablet Commonly known as: XANAX Take 1 tablet (0.5 mg total) by mouth daily as needed for anxiety.   BENADRYL ALLERGY PO Take by mouth.   cetirizine-pseudoephedrine 5-120 MG tablet Commonly known as: ZYRTEC-D Take by mouth.   Daily Vites tablet Take 1 tablet by mouth daily.   escitalopram 10 MG tablet Commonly known as: Lexapro Take 1 tablet (10 mg total) by mouth daily.   HM 4X Probiotic Tabs Take by mouth.        All past medical history, surgical history, allergies, family history, immunizations andmedications were updated in the EMR today and reviewed under the history and medication portions of their EMR.     ROS Negative, with the exception of above mentioned in  HPI   Objective:  BP 104/70    Pulse 93    Temp 99.1 F (37.3 C) (Oral)    Ht 5\' 4"  (1.626 m)    Wt 168 lb (76.2 kg)    LMP 06/03/2021    SpO2 99%    BMI 28.84 kg/m  Body mass index is 28.84 kg/m.  Physical Exam Gen: Afebrile. No acute distress. Nontoxic in appearance, well developed, well nourished.  HENT: AT. [place].  Eyes:Pupils Equal Round Reactive to light, Extraocular movements intact,  Conjunctiva without redness, discharge or icterus. Skin: macular rash abd resolving rash, No purpura or petechiae.  Neuro: Normal gait. PERLA. EOMi. Alert. Oriented x3  Psych: Normal affect, dress and demeanor. Normal speech. Normal thought content and judgment.  No results found. No results found. No results found for this or any previous visit (from the past 24 hour(s)).  Assessment/Plan: Tina Hood is a 50 y.o. female present for OV for  Leukopenia, unspecified type-chronic and stable There is also fhx of mild leukopenia suggesting possible mild form thalassemia. Pt also reports IDA in the past. Went over labs in detail with patient today, including path report. Reassured this seems to be a chronic benign cause and no further intervention should be needed.   Rash: may put otc hydrocortisone if desired, appears resolving. Thankfully this occurred after she completed the shingrix series.   Reviewed expectations re: course of current medical issues. Discussed self-management of symptoms. Outlined signs and symptoms indicating need for more acute intervention. Patient verbalized understanding and all questions were answered. Patient received an After-Visit Summary.    No orders of the defined types were placed in this encounter.  No orders of the defined types were placed in this encounter.  Referral Orders  No referral(s) requested today    > 20 Minutes was dedicated to this patient's encounter to include pre-visit labs, lab interpretation, review of prior records with her today,  face-to-face time with patient counseling.  .  Note is dictated utilizing voice recognition software. Although note has been proof read prior to signing, occasional typographical errors still can be missed. If any questions arise, please do not hesitate to call for verification.   electronically signed by:  44, DO  [facility] [place]

## 2021-10-31 DIAGNOSIS — M531 Cervicobrachial syndrome: Secondary | ICD-10-CM | POA: Diagnosis not present

## 2021-10-31 DIAGNOSIS — M9904 Segmental and somatic dysfunction of sacral region: Secondary | ICD-10-CM | POA: Diagnosis not present

## 2021-10-31 DIAGNOSIS — M5386 Other specified dorsopathies, lumbar region: Secondary | ICD-10-CM | POA: Diagnosis not present

## 2021-10-31 DIAGNOSIS — M9903 Segmental and somatic dysfunction of lumbar region: Secondary | ICD-10-CM | POA: Diagnosis not present

## 2021-11-28 DIAGNOSIS — M5386 Other specified dorsopathies, lumbar region: Secondary | ICD-10-CM | POA: Diagnosis not present

## 2021-11-28 DIAGNOSIS — M531 Cervicobrachial syndrome: Secondary | ICD-10-CM | POA: Diagnosis not present

## 2021-11-28 DIAGNOSIS — M9903 Segmental and somatic dysfunction of lumbar region: Secondary | ICD-10-CM | POA: Diagnosis not present

## 2021-11-28 DIAGNOSIS — M9904 Segmental and somatic dysfunction of sacral region: Secondary | ICD-10-CM | POA: Diagnosis not present

## 2021-12-12 DIAGNOSIS — M9904 Segmental and somatic dysfunction of sacral region: Secondary | ICD-10-CM | POA: Diagnosis not present

## 2021-12-12 DIAGNOSIS — M5386 Other specified dorsopathies, lumbar region: Secondary | ICD-10-CM | POA: Diagnosis not present

## 2021-12-12 DIAGNOSIS — M531 Cervicobrachial syndrome: Secondary | ICD-10-CM | POA: Diagnosis not present

## 2021-12-12 DIAGNOSIS — M9903 Segmental and somatic dysfunction of lumbar region: Secondary | ICD-10-CM | POA: Diagnosis not present

## 2021-12-26 DIAGNOSIS — M9904 Segmental and somatic dysfunction of sacral region: Secondary | ICD-10-CM | POA: Diagnosis not present

## 2021-12-26 DIAGNOSIS — M531 Cervicobrachial syndrome: Secondary | ICD-10-CM | POA: Diagnosis not present

## 2021-12-26 DIAGNOSIS — M9903 Segmental and somatic dysfunction of lumbar region: Secondary | ICD-10-CM | POA: Diagnosis not present

## 2021-12-26 DIAGNOSIS — M5386 Other specified dorsopathies, lumbar region: Secondary | ICD-10-CM | POA: Diagnosis not present

## 2022-01-09 DIAGNOSIS — M9903 Segmental and somatic dysfunction of lumbar region: Secondary | ICD-10-CM | POA: Diagnosis not present

## 2022-01-09 DIAGNOSIS — M9904 Segmental and somatic dysfunction of sacral region: Secondary | ICD-10-CM | POA: Diagnosis not present

## 2022-01-09 DIAGNOSIS — M5386 Other specified dorsopathies, lumbar region: Secondary | ICD-10-CM | POA: Diagnosis not present

## 2022-01-09 DIAGNOSIS — M531 Cervicobrachial syndrome: Secondary | ICD-10-CM | POA: Diagnosis not present

## 2022-01-23 DIAGNOSIS — M9903 Segmental and somatic dysfunction of lumbar region: Secondary | ICD-10-CM | POA: Diagnosis not present

## 2022-01-23 DIAGNOSIS — M531 Cervicobrachial syndrome: Secondary | ICD-10-CM | POA: Diagnosis not present

## 2022-01-23 DIAGNOSIS — M9904 Segmental and somatic dysfunction of sacral region: Secondary | ICD-10-CM | POA: Diagnosis not present

## 2022-01-23 DIAGNOSIS — M5386 Other specified dorsopathies, lumbar region: Secondary | ICD-10-CM | POA: Diagnosis not present

## 2022-02-07 DIAGNOSIS — Z1231 Encounter for screening mammogram for malignant neoplasm of breast: Secondary | ICD-10-CM | POA: Diagnosis not present

## 2022-02-07 LAB — HM MAMMOGRAPHY

## 2022-02-11 ENCOUNTER — Telehealth: Payer: Self-pay

## 2022-02-11 NOTE — Telephone Encounter (Signed)
Received Mammogram results from [facility] on 02/11/22. Will place on PCP desk for review. ? ?

## 2022-02-13 DIAGNOSIS — M5386 Other specified dorsopathies, lumbar region: Secondary | ICD-10-CM | POA: Diagnosis not present

## 2022-02-13 DIAGNOSIS — M9903 Segmental and somatic dysfunction of lumbar region: Secondary | ICD-10-CM | POA: Diagnosis not present

## 2022-02-13 DIAGNOSIS — M9904 Segmental and somatic dysfunction of sacral region: Secondary | ICD-10-CM | POA: Diagnosis not present

## 2022-02-13 DIAGNOSIS — M531 Cervicobrachial syndrome: Secondary | ICD-10-CM | POA: Diagnosis not present

## 2022-02-27 DIAGNOSIS — M5386 Other specified dorsopathies, lumbar region: Secondary | ICD-10-CM | POA: Diagnosis not present

## 2022-02-27 DIAGNOSIS — M9903 Segmental and somatic dysfunction of lumbar region: Secondary | ICD-10-CM | POA: Diagnosis not present

## 2022-02-27 DIAGNOSIS — M9904 Segmental and somatic dysfunction of sacral region: Secondary | ICD-10-CM | POA: Diagnosis not present

## 2022-02-27 DIAGNOSIS — M531 Cervicobrachial syndrome: Secondary | ICD-10-CM | POA: Diagnosis not present

## 2022-03-04 ENCOUNTER — Ambulatory Visit: Payer: BC Managed Care – PPO | Admitting: Family Medicine

## 2022-03-04 ENCOUNTER — Encounter: Payer: Self-pay | Admitting: Family Medicine

## 2022-03-04 VITALS — BP 96/63 | HR 96 | Temp 98.4°F | Ht 64.0 in | Wt 169.0 lb

## 2022-03-04 DIAGNOSIS — F410 Panic disorder [episodic paroxysmal anxiety] without agoraphobia: Secondary | ICD-10-CM | POA: Diagnosis not present

## 2022-03-04 DIAGNOSIS — F419 Anxiety disorder, unspecified: Secondary | ICD-10-CM

## 2022-03-04 MED ORDER — ESCITALOPRAM OXALATE 10 MG PO TABS: 10.0000 mg | ORAL_TABLET | Freq: Every day | ORAL | 1 refills | Status: DC

## 2022-03-04 MED ORDER — ALPRAZOLAM 0.5 MG PO TABS: 0.5000 mg | ORAL_TABLET | Freq: Every day | ORAL | 1 refills | Status: DC | PRN

## 2022-03-04 MED ORDER — ESCITALOPRAM OXALATE 10 MG PO TABS: 10.0000 mg | ORAL_TABLET | Freq: Every day | ORAL | 2 refills | Status: DC

## 2022-03-04 NOTE — Progress Notes (Signed)
? ? ? ? ?Tina Hood , [birth year], 51 y.o., female ?MRN: [id number] ?Patient Care Team  ?  Relationship Specialty Notifications Start End  ?Tina Hood, Tina Geerts A, DO PCP - General Family Medicine  06/19/21   ?Tina Hood, Tina S, MD Consulting Physician Sports Medicine  06/24/21   ?Tina Hood, Dyanna, MD Consulting Physician Obstetrics  06/24/21   ? Comment: [facility]  ?Lenn Sinkegal, Tina Hood, DPM Consulting Physician Podiatry  06/24/21   ?Scheryl Martenillon, Tina K, MD  Family Medicine  10/24/21   ? ? ?Chief Complaint  ?Patient presents with  ? Anxiety  ?  Cmc; pt is not fasting  ? ?  ?Subjective: Pt presents for an OV  for follow up on  ?Anxiety/panic: Pt reports compliant with lexapro 10 mg and feels it is working well. . She has only had one mild panic attack since starting. She only requires the Xanax Hood few times Hood month to help with her panic now. ? ? ?  03/04/2022  ? 10:53 AM 09/17/2021  ? 10:12 AM 06/24/2021  ?  1:38 PM  ?Depression screen PHQ 2/9  ?Decreased Interest 0 1 1  ?Down, Depressed, Hopeless 0 0 3  ?PHQ - 2 Score 0 1 4  ?Altered sleeping 1 1 0  ?Tired, decreased energy 0 0 0  ?Change in appetite 0 0 1  ?Feeling bad or failure about yourself  0 0 1  ?Trouble concentrating 0 2 0  ?Moving slowly or fidgety/restless 0 0 0  ?Suicidal thoughts 0 0 0  ?PHQ-9 Score 1 4 6   ? ? ?  03/04/2022  ? 10:53 AM 09/17/2021  ? 10:12 AM 06/24/2021  ?  1:38 PM  ?GAD 7 : Generalized Anxiety Score  ?Nervous, Anxious, on Edge 1 2 2   ?Control/stop worrying 1 2 2   ?Worry too much - different things 2 2 2   ?Trouble relaxing 1 1 1   ?Restless 2 1 1   ?Easily annoyed or irritable 0 1 1  ?Afraid - awful might happen 1 0 1  ?Total GAD 7 Score 8 9 10   ? ? ?Allergies  ?Allergen Reactions  ? Bee Venom Anaphylaxis  ? Daypro [Oxaprozin] Rash  ? Lodine [Etodolac] Rash  ? ?Social History  ? ?Social History Narrative  ? Marital status/children/pets: married, G2P2  ? Education/employment: [employer].  ? Safety:   ?   -smoke alarm in the home:Yes  ?   - wears seatbelt: Yes   ?   - Feels safe in their relationships: Yes  ?   ? ?Past Medical History:  ?Diagnosis Date  ? Anxiety   ? Arthritis   ? Horse bite 01/27/2021  ? followed by gyn  ? Leukopenia   ? mild lower WBC chronically - stable  ? Migraines   ? ?Past Surgical History:  ?Procedure Laterality Date  ? CYSTECTOMY    ? GANGLION CYST EXCISION    ? ?Family History  ?Problem Relation Age of Onset  ? Hyperlipidemia Mother   ? Hypertension Mother   ? Arthritis Mother   ? Skin cancer Mother   ? Hearing loss Mother   ? Atrial fibrillation Mother   ? Hypertension Father   ? Hyperlipidemia Father   ? Hearing loss Father   ? Arthritis Father   ? Prostate cancer Father   ? Asthma Sister   ? Arthritis Sister   ? Miscarriages / Stillbirths Sister   ? Miscarriages / Stillbirths Sister   ? Arthritis Sister   ? Hearing loss Maternal Grandmother   ?  Arthritis Maternal Grandmother   ? Early death Paternal Grandmother   ? Alcohol abuse Paternal Grandfather   ? Liver cancer Paternal Grandfather   ? ?Allergies as of 03/04/2022   ? ?   Reactions  ? Bee Venom Anaphylaxis  ? Daypro [oxaprozin] Rash  ? Lodine [etodolac] Rash  ? ?  ? ?  ?Medication List  ?  ? ?  ? Accurate as of Mar 04, 2022 12:14 PM. If you have any questions, ask your nurse or doctor.  ?  ?  ? ?  ? ?ALPRAZolam 0.5 MG tablet ?Commonly known as: Prudy Feeler ?Take 1 tablet (0.5 mg total) by mouth daily as needed for anxiety. ?  ?BENADRYL ALLERGY PO ?Take by mouth. ?  ?cetirizine-pseudoephedrine 5-120 MG tablet ?Commonly known as: ZYRTEC-D ?Take by mouth. ?  ?Daily Vites tablet ?Take 1 tablet by mouth daily. ?  ?escitalopram 10 MG tablet ?Commonly known as: Lexapro ?Take 1 tablet (10 mg total) by mouth daily. ?  ?HM 4X Probiotic Tabs ?Take by mouth. ?  ? ?  ? ? ?All past medical history, surgical history, allergies, family history, immunizations andmedications were updated in the EMR today and reviewed under the history and medication portions of their EMR.    ? ?ROS ?Negative, with the exception of  above mentioned in HPI ? ? ?Objective:  ?BP 96/63   Pulse 96   Temp 98.4 ?F (36.9 ?C) (Oral)   Ht 5\' 4"  (1.626 m)   Wt 169 lb (76.7 kg)   SpO2 98%   BMI 29.01 kg/m?  ?Body mass index is 29.01 kg/m? ?Physical Exam ?Vitals and nursing note reviewed.  ?Constitutional:   ?   General: She is not in acute distress. ?   Appearance: Normal appearance. She is normal weight. She is not ill-appearing or toxic-appearing.  ?Eyes:  ?   Extraocular Movements: Extraocular movements intact.  ?   Conjunctiva/sclera: Conjunctivae normal.  ?   Pupils: Pupils are equal, round, and reactive to light.  ?Neurological:  ?   Mental Status: She is alert and oriented to person, place, and time. Mental status is at baseline.  ?Psychiatric:     ?   Mood and Affect: Mood normal.     ?   Behavior: Behavior normal.     ?   Thought Content: Thought content normal.     ?   Judgment: Judgment normal.  ? ? ?No results found. ?No results found. ?No results found for this or any previous visit (from the past 24 hour([place])). ? ?Assessment/Plan: ?Tina Hood is Hood 51 y.o. female present for OV for  ?Anxiety/Panic disorder (episodic paroxysmal anxiety) ?Stable.  ?Patient declining referral to therapist, at least for now. ?Continue  Xanax 0.5 mg daily as needed-face-to-face visit needed for any refills.  44 [place] controlled substance database reviewed and appropriate today. ?Continue  Lexapro 10 mg  ?TSH- normal ?Follow-up in 5.5 mos cmc ? ?Reviewed expectations re: course of current medical issues. ?Discussed self-management of symptoms. ?Outlined signs and symptoms indicating need for more acute intervention. ?Patient verbalized understanding and all questions were answered. ?Patient received an After-Visit Summary. ? ? ? ?No orders of the defined types were placed in this encounter. ? ?Meds ordered this encounter  ?Medications  ? DISCONTD: escitalopram (LEXAPRO) 10 MG tablet  ?  Sig: Take 1 tablet (10 mg total) by mouth daily.  ?  Dispense:   90 tablet  ?  Refill:  1  ? escitalopram (LEXAPRO) 10 MG tablet  ?  Sig: Take 1 tablet (10 mg total) by mouth daily.  ?  Dispense:  90 tablet  ?  Refill:  2  ? ALPRAZolam (XANAX) 0.5 MG tablet  ?  Sig: Take 1 tablet (0.5 mg total) by mouth daily as needed for anxiety.  ?  Dispense:  90 tablet  ?  Refill:  1  ? ?Referral Orders  ?No referral([place]) requested today  ? ?Note is dictated utilizing voice recognition software. Although note has been proof read prior to signing, occasional typographical errors still can be missed. If any questions arise, please do not hesitate to call for verification.  ? ?electronically signed by: ? ?Felix Pacini, DO  ?[facility] - [place] ? ? ? ?

## 2022-03-04 NOTE — Patient Instructions (Addendum)
Return in about 7 months (around 09/18/2022) for cpe (20 min). ? ? ? ? ? ? ? ?Great to see you today.  ?I have refilled the medication(s) we provide.  ? ?If labs were collected, we will inform you of lab results once received either by echart message or telephone call.  ? - echart message- for normal results that have been seen by the patient already.  ? - telephone call: abnormal results or if patient has not viewed results in their echart. ? ?

## 2022-03-13 DIAGNOSIS — M9904 Segmental and somatic dysfunction of sacral region: Secondary | ICD-10-CM | POA: Diagnosis not present

## 2022-03-13 DIAGNOSIS — M531 Cervicobrachial syndrome: Secondary | ICD-10-CM | POA: Diagnosis not present

## 2022-03-13 DIAGNOSIS — M9903 Segmental and somatic dysfunction of lumbar region: Secondary | ICD-10-CM | POA: Diagnosis not present

## 2022-03-13 DIAGNOSIS — M5386 Other specified dorsopathies, lumbar region: Secondary | ICD-10-CM | POA: Diagnosis not present

## 2022-03-26 DIAGNOSIS — D123 Benign neoplasm of transverse colon: Secondary | ICD-10-CM | POA: Diagnosis not present

## 2022-03-26 DIAGNOSIS — Z1211 Encounter for screening for malignant neoplasm of colon: Secondary | ICD-10-CM | POA: Diagnosis not present

## 2022-03-26 DIAGNOSIS — K5730 Diverticulosis of large intestine without perforation or abscess without bleeding: Secondary | ICD-10-CM | POA: Diagnosis not present

## 2022-03-26 DIAGNOSIS — D122 Benign neoplasm of ascending colon: Secondary | ICD-10-CM | POA: Diagnosis not present

## 2022-03-26 LAB — HM COLONOSCOPY

## 2022-03-27 DIAGNOSIS — M9903 Segmental and somatic dysfunction of lumbar region: Secondary | ICD-10-CM | POA: Diagnosis not present

## 2022-03-27 DIAGNOSIS — M9904 Segmental and somatic dysfunction of sacral region: Secondary | ICD-10-CM | POA: Diagnosis not present

## 2022-03-27 DIAGNOSIS — M531 Cervicobrachial syndrome: Secondary | ICD-10-CM | POA: Diagnosis not present

## 2022-03-27 DIAGNOSIS — M5386 Other specified dorsopathies, lumbar region: Secondary | ICD-10-CM | POA: Diagnosis not present

## 2022-04-03 ENCOUNTER — Ambulatory Visit: Payer: BC Managed Care – PPO | Admitting: Family Medicine

## 2022-04-03 ENCOUNTER — Encounter: Payer: Self-pay | Admitting: Family Medicine

## 2022-04-03 ENCOUNTER — Ambulatory Visit (HOSPITAL_BASED_OUTPATIENT_CLINIC_OR_DEPARTMENT_OTHER)
Admission: RE | Admit: 2022-04-03 | Discharge: 2022-04-03 | Disposition: A | Discharge status: Home / Self Care | Payer: BC Managed Care – PPO | Source: Ambulatory Visit | Attending: Family Medicine | Admitting: Family Medicine

## 2022-04-03 VITALS — BP 103/66 | HR 85 | Temp 98.2°F | Ht 64.0 in | Wt 168.0 lb

## 2022-04-03 DIAGNOSIS — J4521 Mild intermittent asthma with (acute) exacerbation: Secondary | ICD-10-CM | POA: Insufficient documentation

## 2022-04-03 DIAGNOSIS — R051 Acute cough: Secondary | ICD-10-CM | POA: Insufficient documentation

## 2022-04-03 DIAGNOSIS — R591 Generalized enlarged lymph nodes: Secondary | ICD-10-CM | POA: Insufficient documentation

## 2022-04-03 DIAGNOSIS — R062 Wheezing: Secondary | ICD-10-CM | POA: Diagnosis not present

## 2022-04-03 DIAGNOSIS — R059 Cough, unspecified: Secondary | ICD-10-CM | POA: Diagnosis not present

## 2022-04-03 DIAGNOSIS — F410 Panic disorder [episodic paroxysmal anxiety] without agoraphobia: Secondary | ICD-10-CM

## 2022-04-03 DIAGNOSIS — F419 Anxiety disorder, unspecified: Secondary | ICD-10-CM | POA: Diagnosis not present

## 2022-04-03 DIAGNOSIS — R053 Chronic cough: Secondary | ICD-10-CM

## 2022-04-03 MED ORDER — METHYLPREDNISOLONE ACETATE 80 MG/ML IJ SUSP
80.0000 mg | Freq: Once | INTRAMUSCULAR | Status: AC
  Administered 2022-04-03: 80 mg via INTRAMUSCULAR

## 2022-04-03 MED ORDER — FLUCONAZOLE 150 MG PO TABS: 150.0000 mg | ORAL_TABLET | Freq: Once | ORAL | 0 refills | Status: AC

## 2022-04-03 MED ORDER — AMOXICILLIN-POT CLAVULANATE 875-125 MG PO TABS: 1.0000 | ORAL_TABLET | Freq: Two times a day (BID) | ORAL | 0 refills | Status: DC

## 2022-04-03 NOTE — Progress Notes (Signed)
Tina Hood , [birth year], 51 y.o., female MRN: [id number] Patient Care Team    Relationship Specialty Notifications Start End  Natalia LeatherwoodKuneff, Yaneli Keithley A, DO PCP - General Family Medicine  06/19/21   Melina FiddlerBassett, Rebecca S, MD Consulting Physician Sports Medicine  06/24/21   Marlow Baarslark, Dyanna, MD Consulting Physician Obstetrics  06/24/21    Comment: [facility]  Lenn Sinkegal, Norman S, [place] Consulting Physician Podiatry  06/24/21   Scheryl Martenillon, Caitlin K, MD  Family Medicine  10/24/21     Chief Complaint  Patient presents with   Cough    Pt c/o chest tightness, post nasal drip x 8 days gradually improving      Subjective: Pt presents for an OV with complaints of 8 days of feeling chest tightness, wheezing, sore throat and postnasal drip.  She states she had a colonoscopy last week.  Directly after the colonoscopy when she awoke and in the recovery room, she fell and increased them panic and anxiety.  Her mood was altered to the point her husband made a comment on her mood.  She also noticed a sore throat and a cough.  She states the nurses told her she was coughing a good bit during her colonoscopy.  She reports increased phlegm production since that time.  She denies any fevers or chills.  Denies shortness of breath.     03/04/2022   10:53 AM 09/17/2021   10:12 AM 06/24/2021    1:38 PM  Depression screen PHQ 2/9  Decreased Interest 0 1 1  Down, Depressed, Hopeless 0 0 3  PHQ - 2 Score 0 1 4  Altered sleeping 1 1 0  Tired, decreased energy 0 0 0  Change in appetite 0 0 1  Feeling bad or failure about yourself  0 0 1  Trouble concentrating 0 2 0  Moving slowly or fidgety/restless 0 0 0  Suicidal thoughts 0 0 0  PHQ-9 Score 1 4 6     Allergies  Allergen Reactions   Bee Venom Anaphylaxis   Daypro [Oxaprozin] Rash   Lodine [Etodolac] Rash   Social History   Social History Narrative   Marital status/children/pets: married, G2P2   Education/employment: B.A.   Safety:      -smoke alarm in the  home:Yes     - wears seatbelt: Yes     - Feels safe in their relationships: Yes      Past Medical History:  Diagnosis Date   Anxiety    Arthritis    Horse bite 01/27/2021   followed by gyn   Leukopenia    mild lower WBC chronically - stable   Migraines    Past Surgical History:  Procedure Laterality Date   CYSTECTOMY     GANGLION CYST EXCISION     Family History  Problem Relation Age of Onset   Hyperlipidemia Mother    Hypertension Mother    Arthritis Mother    Skin cancer Mother    Hearing loss Mother    Atrial fibrillation Mother    Hypertension Father    Hyperlipidemia Father    Hearing loss Father    Arthritis Father    Prostate cancer Father    Asthma Sister    Arthritis Sister    Miscarriages [place]    Miscarriages / [place] Sister    Arthritis Sister    Hearing loss Maternal Grandmother    Arthritis Maternal Grandmother    Early death Paternal Grandmother    Alcohol abuse  Paternal Grandfather    Liver cancer Paternal Grandfather    Allergies as of 04/03/2022       Reactions   Bee Venom Anaphylaxis   Daypro [oxaprozin] Rash   Lodine [etodolac] Rash        Medication List        Accurate as of April 03, 2022  2:56 PM. If you have any questions, ask your nurse or doctor.          ALPRAZolam 0.5 MG tablet Commonly known as: XANAX Take 1 tablet (0.5 mg total) by mouth daily as needed for anxiety.   amoxicillin-clavulanate 875-125 MG tablet Commonly known as: AUGMENTIN Take 1 tablet by mouth 2 (two) times daily. Started by: Felix Pacini, DO   BENADRYL ALLERGY PO Take by mouth.   cetirizine-pseudoephedrine 5-120 MG tablet Commonly known as: ZYRTEC-D Take by mouth.   Daily Vites tablet Take 1 tablet by mouth daily.   escitalopram 10 MG tablet Commonly known as: Lexapro Take 1 tablet (10 mg total) by mouth daily.   fluconazole 150 MG tablet Commonly known as: DIFLUCAN Take 1 tablet (150 mg total) by mouth once for 1  dose. Started by: Felix Pacini, DO   HM 4X Probiotic Tabs Take by mouth.        All past medical history, surgical history, allergies, family history, immunizations andmedications were updated in the EMR today and reviewed under the history and medication portions of their EMR.     ROS Negative, with the exception of above mentioned in HPI   Objective:  BP 103/66   Pulse 85   Temp 98.2 F (36.8 C) (Oral)   Ht 5\' 4"  (1.626 m)   Wt 168 lb (76.2 kg)   SpO2 99%   BMI 28.84 kg/m  Body mass index is 28.84 kg/m. Physical Exam Vitals and nursing note reviewed.  Constitutional:      General: She is not in acute distress.    Appearance: Normal appearance. She is normal weight. She is not ill-appearing or toxic-appearing.  HENT:     Head: Normocephalic and atraumatic.     Right Ear: Tympanic membrane, ear canal and external ear normal. There is no impacted cerumen.     Left Ear: Tympanic membrane, ear canal and external ear normal. There is no impacted cerumen.     Nose: Congestion and rhinorrhea present.     Mouth/Throat:     Mouth: Mucous membranes are moist.     Pharynx: Posterior oropharyngeal erythema present. No oropharyngeal exudate.  Eyes:     General:        Right eye: No discharge.        Left eye: No discharge.     Extraocular Movements: Extraocular movements intact.     Conjunctiva/sclera: Conjunctivae normal.     Pupils: Pupils are equal, round, and reactive to light.  Cardiovascular:     Rate and Rhythm: Normal rate and regular rhythm.     Heart sounds: No murmur heard. Pulmonary:     Effort: Pulmonary effort is normal. No respiratory distress.     Breath sounds: Normal breath sounds. No wheezing, rhonchi or rales.  Musculoskeletal:     Cervical back: Neck supple.  Lymphadenopathy:     Cervical: Cervical adenopathy (Left cervical and submandibular lymphadenopathy x2, tender to palpation) present.  Skin:    Findings: No rash.  Neurological:     Mental  Status: She is alert and oriented to person, place, and time. Mental status is at  baseline.  Psychiatric:        Mood and Affect: Mood normal.        Behavior: Behavior normal.        Thought Content: Thought content normal.        Judgment: Judgment normal.     No results found. No results found. No results found for this or any previous visit (from the past 24 hour(s)).  Assessment/Plan: Tina Hood is a 51 y.o. female present for OV for  Mild intermittent reactive airway disease with acute exacerbation/acute cough/pharyngitis She has a history of mild reactive airway disease which may be contributing to her current symptoms.  She has tender left neck lymphadenopathy x2. - DG Chest 2 View; Future - methylPREDNISolone acetate (DEPO-MEDROL) injection 80 mg -We will obtain chest x-ray today to rule out possible aspiration pneumonia/pneumonia. Elected to start treatment with Augmentin twice daily x10 days IM Depo-Medrol 80 steroid injection provided today to help with significant swelling/lymphadenopathy.  Anxiety/Panic disorder (episodic paroxysmal anxiety) Suspect she was having a panic attack after her recovery from sedation.   Follow-up dependent upon clinical outcome and chest x-ray results.  Diflucan also prescribed in the event she has a yeast infection from Augmentin.  Reviewed expectations re: course of current medical issues. Discussed self-management of symptoms. Outlined signs and symptoms indicating need for more acute intervention. Patient verbalized understanding and all questions were answered. Patient received an After-Visit Summary.    Orders Placed This Encounter  Procedures   DG Chest 2 View   Meds ordered this encounter  Medications   amoxicillin-clavulanate (AUGMENTIN) 875-125 MG tablet    Sig: Take 1 tablet by mouth 2 (two) times daily.    Dispense:  20 tablet    Refill:  0   fluconazole (DIFLUCAN) 150 MG tablet    Sig: Take 1 tablet (150 mg  total) by mouth once for 1 dose.    Dispense:  1 tablet    Refill:  0   methylPREDNISolone acetate (DEPO-MEDROL) injection 80 mg   Referral Orders  No referral(s) requested today     Note is dictated utilizing voice recognition software. Although note has been proof read prior to signing, occasional typographical errors still can be missed. If any questions arise, please do not hesitate to call for verification.   electronically signed by:  Felix Pacini, DO  [facility] [place]

## 2022-04-03 NOTE — Patient Instructions (Signed)
  We will call you with xray results and final plan

## 2022-04-04 ENCOUNTER — Ambulatory Visit: Payer: BC Managed Care – PPO | Admitting: Family Medicine

## 2022-04-04 ENCOUNTER — Encounter: Payer: Self-pay | Admitting: Family Medicine

## 2022-04-10 DIAGNOSIS — M531 Cervicobrachial syndrome: Secondary | ICD-10-CM | POA: Diagnosis not present

## 2022-04-10 DIAGNOSIS — M9903 Segmental and somatic dysfunction of lumbar region: Secondary | ICD-10-CM | POA: Diagnosis not present

## 2022-04-10 DIAGNOSIS — M5386 Other specified dorsopathies, lumbar region: Secondary | ICD-10-CM | POA: Diagnosis not present

## 2022-04-10 DIAGNOSIS — M9904 Segmental and somatic dysfunction of sacral region: Secondary | ICD-10-CM | POA: Diagnosis not present

## 2022-04-14 ENCOUNTER — Encounter: Payer: Self-pay | Admitting: Family Medicine

## 2022-04-15 ENCOUNTER — Encounter: Payer: Self-pay | Admitting: Family Medicine

## 2022-04-15 ENCOUNTER — Ambulatory Visit: Payer: BC Managed Care – PPO | Admitting: Family Medicine

## 2022-04-15 VITALS — BP 105/72 | HR 77 | Temp 98.0°F | Ht 64.0 in | Wt 168.0 lb

## 2022-04-15 DIAGNOSIS — R052 Subacute cough: Secondary | ICD-10-CM | POA: Diagnosis not present

## 2022-04-15 DIAGNOSIS — J4521 Mild intermittent asthma with (acute) exacerbation: Secondary | ICD-10-CM | POA: Diagnosis not present

## 2022-04-15 DIAGNOSIS — K219 Gastro-esophageal reflux disease without esophagitis: Secondary | ICD-10-CM | POA: Diagnosis not present

## 2022-04-15 DIAGNOSIS — J9811 Atelectasis: Secondary | ICD-10-CM

## 2022-04-15 MED ORDER — ALBUTEROL SULFATE HFA 108 (90 BASE) MCG/ACT IN AERS: 1.0000 | INHALATION_SPRAY | Freq: Four times a day (QID) | RESPIRATORY_TRACT | 1 refills | Status: DC | PRN

## 2022-04-15 MED ORDER — PANTOPRAZOLE SODIUM 20 MG PO TBEC: 20.0000 mg | DELAYED_RELEASE_TABLET | Freq: Every day | ORAL | 2 refills | Status: DC

## 2022-04-15 MED ORDER — SUCRALFATE 1 G PO TABS: 1.0000 g | ORAL_TABLET | Freq: Three times a day (TID) | ORAL | 0 refills | Status: DC

## 2022-04-15 NOTE — Progress Notes (Signed)
Tina Hood , January 21, 1971, 51 y.o., female MRN: [id number] Patient Care Team    Relationship Specialty Notifications Start End  Natalia Leatherwood, DO PCP - General Family Medicine  06/19/21   Melina Fiddler, MD Consulting Physician Sports Medicine  06/24/21   Marlow Baars, MD Consulting Physician Obstetrics  06/24/21    Comment: [facility]  Lenn Sink, [place] Consulting Physician Podiatry  06/24/21   Scheryl Marten, MD  Family Medicine  10/24/21     Chief Complaint  Patient presents with   Cough    F/u; some improving sx but still have cough, ear fullness, chest tightness x 3 weeks     Subjective; Tina Hood is a 51 y.o. female present for follow-up on sore throat and lymphadenopathy.  She reports she has noticed some improvement in her symptoms.  The throat swelling has gone down.  She still has some cough component, but is a more of a dry tickle cough.  Prior appointment. Pt presents for an OV with complaints of 8 days of feeling chest tightness, wheezing, sore throat and postnasal drip.  She states she had a colonoscopy last week.  Directly after the colonoscopy when she awoke and in the recovery room, she fell and increased them panic and anxiety.  Her mood was altered to the point her husband made a comment on her mood.  She also noticed a sore throat and a cough.  She states the nurses told her she was coughing a good bit during her colonoscopy.  She reports increased phlegm production since that time.  She denies any fevers or chills.  Denies shortness of breath.     03/04/2022   10:53 AM 09/17/2021   10:12 AM 06/24/2021    1:38 PM  Depression screen PHQ 2/9  Decreased Interest 0 1 1  Down, Depressed, Hopeless 0 0 3  PHQ - 2 Score 0 1 4  Altered sleeping 1 1 0  Tired, decreased energy 0 0 0  Change in appetite 0 0 1  Feeling bad or failure about yourself  0 0 1  Trouble concentrating 0 2 0  Moving slowly or fidgety/restless 0 0 0  Suicidal  thoughts 0 0 0  PHQ-9 Score 1 4 6     Allergies  Allergen Reactions   Bee Venom Anaphylaxis   Daypro [Oxaprozin] Rash   Lodine [Etodolac] Rash   Social History   Social History Narrative   Marital status/children/pets: married, G2P2   Education/employment: B.A.   Safety:      -smoke alarm in the home:Yes     - wears seatbelt: Yes     - Feels safe in their relationships: Yes      Past Medical History:  Diagnosis Date   Anxiety    Arthritis    Horse bite 01/27/2021   followed by gyn   Leukopenia    mild lower WBC chronically - stable   Migraines    Past Surgical History:  Procedure Laterality Date   CYSTECTOMY     GANGLION CYST EXCISION     Family History  Problem Relation Age of Onset   Hyperlipidemia Mother    Hypertension Mother    Arthritis Mother    Skin cancer Mother    Hearing loss Mother    Atrial fibrillation Mother    Hypertension Father    Hyperlipidemia Father    Hearing loss Father    Arthritis Father    Prostate cancer Father  Asthma Sister    Arthritis Sister    Miscarriages [place]    Miscarriages / [place] Sister    Arthritis Sister    Hearing loss Maternal Grandmother    Arthritis Maternal Grandmother    Early death Paternal Grandmother    Alcohol abuse Paternal Grandfather    Liver cancer Paternal Grandfather    Allergies as of 04/15/2022       Reactions   Bee Venom Anaphylaxis   Daypro [oxaprozin] Rash   Lodine [etodolac] Rash        Medication List        Accurate as of April 15, 2022 11:59 PM. If you have any questions, ask your nurse or doctor.          STOP taking these medications    amoxicillin-clavulanate 875-125 MG tablet Commonly known as: AUGMENTIN Stopped by: Felix Pacinienee Deylan Canterbury, DO       TAKE these medications    albuterol 108 (90 Base) MCG/ACT inhaler Commonly known as: VENTOLIN HFA Inhale 1-2 puffs into the lungs every 6 (six) hours as needed for wheezing or shortness of  breath. Started by: Felix Pacinienee Tvisha Schwoerer, DO   ALPRAZolam 0.5 MG tablet Commonly known as: XANAX Take 1 tablet (0.5 mg total) by mouth daily as needed for anxiety.   BENADRYL ALLERGY PO Take by mouth.   cetirizine-pseudoephedrine 5-120 MG tablet Commonly known as: ZYRTEC-D Take by mouth.   Daily Vites tablet Take 1 tablet by mouth daily.   escitalopram 10 MG tablet Commonly known as: Lexapro Take 1 tablet (10 mg total) by mouth daily.   HM 4X Probiotic Tabs Take by mouth.   pantoprazole 20 MG tablet Commonly known as: Protonix Take 1 tablet (20 mg total) by mouth daily. Started by: Felix Pacinienee Robbin Loughmiller, DO   sucralfate 1 g tablet Commonly known as: Carafate Take 1 tablet (1 g total) by mouth 4 (four) times daily -  with meals and at bedtime. Started by: Felix Pacinienee Deitra Craine, DO        All past medical history, surgical history, allergies, family history, immunizations andmedications were updated in the EMR today and reviewed under the history and medication portions of their EMR.     Review of Systems  Constitutional:  Negative for chills, fever and malaise/fatigue.  HENT:  Negative for sinus pain and sore throat.   Respiratory:  Positive for cough and shortness of breath. Negative for sputum production and wheezing.   Cardiovascular:  Negative for chest pain.   Negative, with the exception of above mentioned in HPI   Objective:  BP 105/72   Pulse 77   Temp 98 F (36.7 C) (Oral)   Ht 5\' 4"  (1.626 m)   Wt 168 lb (76.2 kg)   SpO2 95%   BMI 28.84 kg/m  Body mass index is 28.84 kg/m. Physical Exam Vitals and nursing note reviewed.  Constitutional:      General: She is not in acute distress.    Appearance: Normal appearance. She is not ill-appearing, toxic-appearing or diaphoretic.  HENT:     Head: Normocephalic and atraumatic.     Right Ear: Tympanic membrane normal.     Left Ear: Tympanic membrane normal.     Nose: No congestion or rhinorrhea.     Mouth/Throat:     Mouth:  Mucous membranes are moist.     Pharynx: No oropharyngeal exudate or posterior oropharyngeal erythema.  Eyes:     General: No scleral icterus.       Right  eye: No discharge.        Left eye: No discharge.     Extraocular Movements: Extraocular movements intact.     Conjunctiva/sclera: Conjunctivae normal.     Pupils: Pupils are equal, round, and reactive to light.  Cardiovascular:     Rate and Rhythm: Normal rate and regular rhythm.     Heart sounds: No murmur heard. Pulmonary:     Effort: Pulmonary effort is normal. No respiratory distress.     Breath sounds: Normal breath sounds. No wheezing, rhonchi or rales.  Musculoskeletal:     Cervical back: Neck supple. No tenderness.  Lymphadenopathy:     Cervical: No cervical adenopathy.  Skin:    General: Skin is warm and dry.     Coloration: Skin is not jaundiced or pale.     Findings: No erythema or rash.  Neurological:     Mental Status: She is alert and oriented to person, place, and time. Mental status is at baseline.     Motor: No weakness.     Gait: Gait normal.  Psychiatric:        Mood and Affect: Mood normal.        Behavior: Behavior normal.        Thought Content: Thought content normal.        Judgment: Judgment normal.       No results found. No results found. No results found for this or any previous visit (from the past 24 hour(s)).  Assessment/Plan: Falan Hensler is a 51 y.o. female present for OV for  Subacute cough/atelectasis - DG Chest 2 View; Future> have completed after July 4 holiday for pneumonia follow-up.  Gastroesophageal reflux disease without esophagitis Suspect her current cough is multifactorial. With recent respiratory illness she has been coughing more, which has started up reflux symptoms. Start Protonix daily for 4 weeks, then discontinue.  If symptoms return quickly, restart for 3 months total. Carafate before meals and before bed for 4 weeks.  Mild intermittent reactive airway  disease with acute exacerbation Lung sounds are improving.   Albuterol inhaler prescribed.  1 to 2 puffs every 6 hours as needed.   Patient will be called with chest x-ray results and further plan discussed at that time  Follow-up dependent upon clinical outcome and chest x-ray results.  Diflucan also prescribed in the event she has a yeast infection from Augmentin.  Reviewed expectations re: course of current medical issues. Discussed self-management of symptoms. Outlined signs and symptoms indicating need for more acute intervention. Patient verbalized understanding and all questions were answered. Patient received an After-Visit Summary.    Orders Placed This Encounter  Procedures   DG Chest 2 View   Meds ordered this encounter  Medications   albuterol (VENTOLIN HFA) 108 (90 Base) MCG/ACT inhaler    Sig: Inhale 1-2 puffs into the lungs every 6 (six) hours as needed for wheezing or shortness of breath.    Dispense:  8 g    Refill:  1   pantoprazole (PROTONIX) 20 MG tablet    Sig: Take 1 tablet (20 mg total) by mouth daily.    Dispense:  30 tablet    Refill:  2   sucralfate (CARAFATE) 1 g tablet    Sig: Take 1 tablet (1 g total) by mouth 4 (four) times daily -  with meals and at bedtime.    Dispense:  120 tablet    Refill:  0   Referral Orders  No referral(s) requested today  Note is dictated utilizing voice recognition software. Although note has been proof read prior to signing, occasional typographical errors still can be missed. If any questions arise, please do not hesitate to call for verification.   electronically signed by:  Howard Pouch, DO  [place]

## 2022-04-15 NOTE — Patient Instructions (Signed)
Albuterol inhaler every 6 hours if needed.  Carafate tab in water (to pepto bismol consistency) before meals and before bed  Protonix daily about 30 min prior to breakfast

## 2022-04-21 DIAGNOSIS — Z01419 Encounter for gynecological examination (general) (routine) without abnormal findings: Secondary | ICD-10-CM | POA: Diagnosis not present

## 2022-04-21 DIAGNOSIS — Z6829 Body mass index (BMI) 29.0-29.9, adult: Secondary | ICD-10-CM | POA: Diagnosis not present

## 2022-04-22 LAB — HM PAP SMEAR

## 2022-05-01 ENCOUNTER — Ambulatory Visit (HOSPITAL_BASED_OUTPATIENT_CLINIC_OR_DEPARTMENT_OTHER)
Admission: RE | Admit: 2022-05-01 | Discharge: 2022-05-01 | Disposition: A | Discharge status: Home / Self Care | Payer: BC Managed Care – PPO | Source: Ambulatory Visit | Attending: Family Medicine | Admitting: Family Medicine

## 2022-05-01 DIAGNOSIS — R059 Cough, unspecified: Secondary | ICD-10-CM | POA: Diagnosis not present

## 2022-05-01 DIAGNOSIS — J9811 Atelectasis: Secondary | ICD-10-CM | POA: Diagnosis not present

## 2022-05-01 DIAGNOSIS — R052 Subacute cough: Secondary | ICD-10-CM | POA: Diagnosis not present

## 2022-05-01 DIAGNOSIS — J189 Pneumonia, unspecified organism: Secondary | ICD-10-CM | POA: Diagnosis not present

## 2022-05-07 ENCOUNTER — Ambulatory Visit: Payer: BC Managed Care – PPO | Admitting: Family Medicine

## 2022-05-08 DIAGNOSIS — M9903 Segmental and somatic dysfunction of lumbar region: Secondary | ICD-10-CM | POA: Diagnosis not present

## 2022-05-08 DIAGNOSIS — M9904 Segmental and somatic dysfunction of sacral region: Secondary | ICD-10-CM | POA: Diagnosis not present

## 2022-05-08 DIAGNOSIS — M531 Cervicobrachial syndrome: Secondary | ICD-10-CM | POA: Diagnosis not present

## 2022-05-08 DIAGNOSIS — M5386 Other specified dorsopathies, lumbar region: Secondary | ICD-10-CM | POA: Diagnosis not present

## 2022-06-05 DIAGNOSIS — M9903 Segmental and somatic dysfunction of lumbar region: Secondary | ICD-10-CM | POA: Diagnosis not present

## 2022-06-05 DIAGNOSIS — M531 Cervicobrachial syndrome: Secondary | ICD-10-CM | POA: Diagnosis not present

## 2022-06-05 DIAGNOSIS — M9904 Segmental and somatic dysfunction of sacral region: Secondary | ICD-10-CM | POA: Diagnosis not present

## 2022-06-05 DIAGNOSIS — M5386 Other specified dorsopathies, lumbar region: Secondary | ICD-10-CM | POA: Diagnosis not present

## 2022-06-19 DIAGNOSIS — M5386 Other specified dorsopathies, lumbar region: Secondary | ICD-10-CM | POA: Diagnosis not present

## 2022-06-19 DIAGNOSIS — M9903 Segmental and somatic dysfunction of lumbar region: Secondary | ICD-10-CM | POA: Diagnosis not present

## 2022-06-19 DIAGNOSIS — M9904 Segmental and somatic dysfunction of sacral region: Secondary | ICD-10-CM | POA: Diagnosis not present

## 2022-06-19 DIAGNOSIS — M531 Cervicobrachial syndrome: Secondary | ICD-10-CM | POA: Diagnosis not present

## 2022-09-18 DIAGNOSIS — M9903 Segmental and somatic dysfunction of lumbar region: Secondary | ICD-10-CM | POA: Diagnosis not present

## 2022-09-18 DIAGNOSIS — M5386 Other specified dorsopathies, lumbar region: Secondary | ICD-10-CM | POA: Diagnosis not present

## 2022-09-18 DIAGNOSIS — M531 Cervicobrachial syndrome: Secondary | ICD-10-CM | POA: Diagnosis not present

## 2022-09-18 DIAGNOSIS — M9904 Segmental and somatic dysfunction of sacral region: Secondary | ICD-10-CM | POA: Diagnosis not present

## 2022-09-19 ENCOUNTER — Ambulatory Visit: Payer: BC Managed Care – PPO | Admitting: Family Medicine

## 2022-09-19 ENCOUNTER — Encounter: Payer: Self-pay | Admitting: Family Medicine

## 2022-09-19 VITALS — BP 119/76 | HR 72 | Temp 98.0°F | Ht 63.58 in | Wt 179.6 lb

## 2022-09-19 DIAGNOSIS — F419 Anxiety disorder, unspecified: Secondary | ICD-10-CM

## 2022-09-19 DIAGNOSIS — F410 Panic disorder [episodic paroxysmal anxiety] without agoraphobia: Secondary | ICD-10-CM | POA: Diagnosis not present

## 2022-09-19 DIAGNOSIS — J4521 Mild intermittent asthma with (acute) exacerbation: Secondary | ICD-10-CM | POA: Diagnosis not present

## 2022-09-19 DIAGNOSIS — Z0000 Encounter for general adult medical examination without abnormal findings: Secondary | ICD-10-CM | POA: Diagnosis not present

## 2022-09-19 DIAGNOSIS — E663 Overweight: Secondary | ICD-10-CM | POA: Diagnosis not present

## 2022-09-19 DIAGNOSIS — Z1231 Encounter for screening mammogram for malignant neoplasm of breast: Secondary | ICD-10-CM

## 2022-09-19 DIAGNOSIS — Z79899 Other long term (current) drug therapy: Secondary | ICD-10-CM | POA: Diagnosis not present

## 2022-09-19 DIAGNOSIS — K219 Gastro-esophageal reflux disease without esophagitis: Secondary | ICD-10-CM | POA: Insufficient documentation

## 2022-09-19 LAB — COMPREHENSIVE METABOLIC PANEL
ALT: 14 U/L (ref 0–35)
AST: 19 U/L (ref 0–37)
Albumin: 4.8 g/dL (ref 3.5–5.2)
Alkaline Phosphatase: 72 U/L (ref 39–117)
BUN: 14 mg/dL (ref 6–23)
CO2: 28 mEq/L (ref 19–32)
Calcium: 9.4 mg/dL (ref 8.4–10.5)
Chloride: 103 mEq/L (ref 96–112)
Creatinine, Ser: 0.84 mg/dL (ref 0.40–1.20)
GFR: 79.7 mL/min (ref >60.00)
Glucose, Bld: 92 mg/dL (ref 70–99)
Potassium: 4.2 mEq/L (ref 3.5–5.1)
Sodium: 139 mEq/L (ref 135–145)
Total Bilirubin: 0.4 mg/dL (ref 0.2–1.2)
Total Protein: 7.3 g/dL (ref 6.0–8.3)

## 2022-09-19 LAB — CBC WITH DIFFERENTIAL/PLATELET
Basophils Absolute: 0 10*3/uL (ref 0.0–0.1)
Basophils Relative: 1.3 % (ref 0.0–3.0)
Eosinophils Absolute: 0.1 10*3/uL (ref 0.0–0.7)
Eosinophils Relative: 5.1 % — ABNORMAL HIGH (ref 0.0–5.0)
HCT: 41.2 % (ref 36.0–46.0)
Hemoglobin: 13.6 g/dL (ref 12.0–15.0)
Lymphocytes Relative: 28.8 % (ref 12.0–46.0)
Lymphs Abs: 0.8 10*3/uL (ref 0.7–4.0)
MCHC: 32.9 g/dL (ref 30.0–36.0)
MCV: 92.7 fl (ref 78.0–100.0)
Monocytes Absolute: 0.2 10*3/uL (ref 0.1–1.0)
Monocytes Relative: 7.3 % (ref 3.0–12.0)
Neutro Abs: 1.7 10*3/uL (ref 1.4–7.7)
Neutrophils Relative %: 57.5 % (ref 43.0–77.0)
Platelets: 251 10*3/uL (ref 150.0–400.0)
RBC: 4.45 Mil/uL (ref 3.87–5.11)
RDW: 13.6 % (ref 11.5–15.5)
WBC: 2.9 10*3/uL — ABNORMAL LOW (ref 4.0–10.5)

## 2022-09-19 LAB — LIPID PANEL
Cholesterol: 196 mg/dL (ref 0–200)
HDL: 92.1 mg/dL (ref >39.00)
LDL Cholesterol: 93 mg/dL (ref 0–99)
NonHDL: 103.78
Total CHOL/HDL Ratio: 2
Triglycerides: 55 mg/dL (ref 0.0–149.0)
VLDL: 11 mg/dL (ref 0.0–40.0)

## 2022-09-19 LAB — HEMOGLOBIN A1C: Hgb A1c MFr Bld: 5.8 % (ref 4.6–6.5)

## 2022-09-19 LAB — TSH: TSH: 1.14 u[IU]/mL (ref 0.35–5.50)

## 2022-09-19 MED ORDER — ALPRAZOLAM 0.5 MG PO TABS
0.5000 mg | ORAL_TABLET | Freq: Every day | ORAL | 1 refills | Status: DC | PRN
Start: 2022-09-19 — End: 2023-04-06

## 2022-09-19 MED ORDER — ALBUTEROL SULFATE HFA 108 (90 BASE) MCG/ACT IN AERS
1.0000 | INHALATION_SPRAY | Freq: Four times a day (QID) | RESPIRATORY_TRACT | 1 refills | Status: AC | PRN
Start: 2022-09-19 — End: ?

## 2022-09-19 MED ORDER — PANTOPRAZOLE SODIUM 20 MG PO TBEC
20.0000 mg | DELAYED_RELEASE_TABLET | Freq: Every day | ORAL | 3 refills | Status: DC
Start: 2022-09-19 — End: 2023-10-14

## 2022-09-19 MED ORDER — ESCITALOPRAM OXALATE 10 MG PO TABS: 10.0000 mg | ORAL_TABLET | Freq: Every day | ORAL | 2 refills | Status: DC

## 2022-09-19 NOTE — Patient Instructions (Addendum)
Return in about 25 weeks (around 03/13/2023) for Routine chronic condition follow-up.        Great to see you today.  I have refilled the medication(s) we provide.   If labs were collected, we will inform you of lab results once received either by echart message or telephone call.   - echart message- for normal results that have been seen by the patient already.   - telephone call: abnormal results or if patient has not viewed results in their echart. Health Maintenance, Female Adopting a healthy lifestyle and getting preventive care are important in promoting health and wellness. Ask your health care provider about: The right schedule for you to have regular tests and exams. Things you can do on your own to prevent diseases and keep yourself healthy. What should I know about diet, weight, and exercise? Eat a healthy diet  Eat a diet that includes plenty of vegetables, fruits, low-fat dairy products, and lean protein. Do not eat a lot of foods that are high in solid fats, added sugars, or sodium. Maintain a healthy weight Body mass index (BMI) is used to identify weight problems. It estimates body fat based on height and weight. Your health care provider can help determine your BMI and help you achieve or maintain a healthy weight. Get regular exercise Get regular exercise. This is one of the most important things you can do for your health. Most adults should: Exercise for at least 150 minutes each week. The exercise should increase your heart rate and make you sweat (moderate-intensity exercise). Do strengthening exercises at least twice a week. This is in addition to the moderate-intensity exercise. Spend less time sitting. Even light physical activity can be beneficial. Watch cholesterol and blood lipids Have your blood tested for lipids and cholesterol at 52 years of age, then have this test every 5 years. Have your cholesterol levels checked more often if: Your lipid or  cholesterol levels are high. You are older than 52 years of age. You are at high risk for heart disease. What should I know about cancer screening? Depending on your health history and family history, you may need to have cancer screening at various ages. This may include screening for: Breast cancer. Cervical cancer. Colorectal cancer. Skin cancer. Lung cancer. What should I know about heart disease, diabetes, and high blood pressure? Blood pressure and heart disease High blood pressure causes heart disease and increases the risk of stroke. This is more likely to develop in people who have high blood pressure readings or are overweight. Have your blood pressure checked: Every 3-5 years if you are 46-48 years of age. Every year if you are 86 years old or older. Diabetes Have regular diabetes screenings. This checks your fasting blood sugar level. Have the screening done: Once every three years after age 58 if you are at a normal weight and have a low risk for diabetes. More often and at a younger age if you are overweight or have a high risk for diabetes. What should I know about preventing infection? Hepatitis B If you have a higher risk for hepatitis B, you should be screened for this virus. Talk with your health care provider to find out if you are at risk for hepatitis B infection. Hepatitis C Testing is recommended for: Everyone born from [birth year] through 1966. Anyone with known risk factors for hepatitis C. Sexually transmitted infections (STIs) Get screened for STIs, including gonorrhea and chlamydia, if: You are sexually active and are younger  than 52 years of age. You are older than 52 years of age and your health care provider tells you that you are at risk for this type of infection. Your sexual activity has changed since you were last screened, and you are at increased risk for chlamydia or gonorrhea. Ask your health care provider if you are at risk. Ask your health care  provider about whether you are at high risk for HIV. Your health care provider may recommend a prescription medicine to help prevent HIV infection. If you choose to take medicine to prevent HIV, you should first get tested for HIV. You should then be tested every 3 months for as long as you are taking the medicine. Pregnancy If you are about to stop having your period (premenopausal) and you may become pregnant, seek counseling before you get pregnant. Take 400 to 800 micrograms (mcg) of folic acid every day if you become pregnant. Ask for birth control (contraception) if you want to prevent pregnancy. Osteoporosis and menopause Osteoporosis is a disease in which the bones lose minerals and strength with aging. This can result in bone fractures. If you are 27 years old or older, or if you are at risk for osteoporosis and fractures, ask your health care provider if you should: Be screened for bone loss. Take a calcium or vitamin D supplement to lower your risk of fractures. Be given hormone replacement therapy (HRT) to treat symptoms of menopause. Follow these instructions at home: Alcohol use Do not drink alcohol if: Your health care provider tells you not to drink. You are pregnant, may be pregnant, or are planning to become pregnant. If you drink alcohol: Limit how much you have to: 0-1 drink a day. Know how much alcohol is in your drink. In the [place] one drink equals one 12 oz bottle of beer (355 mL), one 5 oz glass of wine (148 mL), or one 1 oz glass of hard liquor (44 mL). Lifestyle Do not use any products that contain nicotine or tobacco. These products include cigarettes, chewing tobacco, and vaping devices, such as e-cigarettes. If you need help quitting, ask your health care provider. Do not use street drugs. Do not share needles. Ask your health care provider for help if you need support or information about quitting drugs. General instructions Schedule regular health, dental, and  eye exams. Stay current with your vaccines. Tell your health care provider if: You often feel depressed. You have ever been abused or do not feel safe at home. Summary Adopting a healthy lifestyle and getting preventive care are important in promoting health and wellness. Follow your health care provider's instructions about healthy diet, exercising, and getting tested or screened for diseases. Follow your health care provider's instructions on monitoring your cholesterol and blood pressure. This information is not intended to replace advice given to you by your health care provider. Make sure you discuss any questions you have with your health care provider. Document Revised: 03/11/2021 Document Reviewed: 03/11/2021 Elsevier Patient Education  [employer].

## 2022-09-19 NOTE — Progress Notes (Signed)
Patient ID: Tina Hood, female  DOB: [birth year], 51 y.o.   MRN: [id number] Patient Care Team    Relationship Specialty Notifications Start End  Ma Hillock, DO PCP - General Family Medicine  06/19/21   Verner Chol, MD Consulting Physician Sports Medicine  06/24/21   Jerelyn Charles, MD Consulting Physician Obstetrics  06/24/21    Comment: [facility]  Wallene Huh, [place] Consulting Physician Podiatry  06/24/21   Madelin Rear, MD  Family Medicine  10/24/21     Chief Complaint  Patient presents with   Annual Exam    [facility]; Pt is fasting    Subjective: Tina Hood is a 51 y.o.  Female  present for CPE/cmc All past medical history, surgical history, allergies, family history, immunizations, medications and social history were updated in the electronic medical record today. All recent labs, ED visits and hospitalizations within the last year were reviewed.  Anxiety/panic: Pt reports she has recently tapered up to lexapro 10 mg and feels it is working well. She has only had one mild panic attack since starting. She does not need refills on xanax.  Prior note Patient reports she has had anxiety and panic attacks since she was in high school.  She has been prescribed Xanax 0.5 mg daily as needed for panic for many years.  She states she uses this medication infrequently.  Secondary to her mother's health is causing her added stress.    Health maintenance:  Colonoscopy: scheduled for 5/4//2023. [facility]- Dr. Therisa Doyne- 9 yr Mammogram: completed: 02/2022 [facility] [address]> ordered for 2024 Cervical cancer screening: last pap: 09/2020- WNL-neg HPV Immunizations: tdap UTD 2015, Influenza UTD 13086 (encouraged yearly), shingrix series completed. Covid series completed.  Infectious disease screening: HIV and Hep C completed DEXA: routine screen Assistive device: none Oxygen [id number] Patient has a Dental home. Hospitalizations/ED visits: reviewed     09/19/2022     9:14 AM 03/04/2022   10:53 AM 09/17/2021   10:12 AM 06/24/2021    1:38 PM  Depression screen PHQ 2/9  Decreased Interest 0 0 1 1  Down, Depressed, Hopeless 0 0 0 3  PHQ - 2 Score 0 0 1 4  Altered sleeping 2 1 1  0  Tired, decreased energy 1 0 0 0  Change in appetite  0 0 1  Feeling bad or failure about yourself  0 0 0 1  Trouble concentrating  0 2 0  Moving slowly or fidgety/restless 0 0 0 0  Suicidal thoughts 0 0 0 0  PHQ-9 Score 3 1 4 6       09/19/2022    9:14 AM 03/04/2022   10:53 AM 09/17/2021   10:12 AM 06/24/2021    1:38 PM  GAD 7 : Generalized Anxiety Score  Nervous, Anxious, on Edge 1 1 2 2   Control/stop worrying 1 1 2 2   Worry too much - different things 1 2 2 2   Trouble relaxing 1 1 1 1   Restless 0 2 1 1   Easily annoyed or irritable 0 0 1 1  Afraid - awful might happen 1 1 0 1  Total GAD 7 Score 5 8 9 10     Immunization History  Administered Date(s) Administered   Influenza Inj Mdck Quad Pf 07/28/2022   Influenza Split 08/08/2009, 08/03/2010, 08/04/2012, 08/03/2013, 07/12/2020   Influenza,inj,Quad PF,6+ Mos 09/07/2014, 08/22/2015, 09/23/2016   Influenza-Unspecified 08/08/2009, 08/03/2010, 08/04/2012, 08/03/2013, 07/12/2020, 09/02/2021   PFIZER(Purple Top)SARS-COV-2 Vaccination 01/27/2020, 02/17/2020, 10/06/2020  Tdap 10/04/2003, 11/22/2013   Zoster Recombinat (Shingrix) 06/24/2021, 09/17/2021   Past Medical History:  Diagnosis Date   Anxiety    Arthritis    Horse bite 01/27/2021   followed by gyn   Leukopenia    mild lower WBC chronically - stable   Migraines    Allergies  Allergen Reactions   Bee Venom Anaphylaxis   Daypro [Oxaprozin] Rash   Lodine [Etodolac] Rash   Past Surgical History:  Procedure Laterality Date   CYSTECTOMY     GANGLION CYST EXCISION     Family History  Problem Relation Age of Onset   Hyperlipidemia Mother    Hypertension Mother    Arthritis Mother    Skin cancer Mother    Hearing loss Mother    Atrial fibrillation  Mother    Hypertension Father    Hyperlipidemia Father    Hearing loss Father    Arthritis Father    Prostate cancer Father    Asthma Sister    Arthritis Sister    Miscarriages / [place] Sister    Miscarriages / [place] Sister    Arthritis Sister    Hearing loss Maternal Grandmother    Arthritis Maternal Grandmother    Early death Paternal Grandmother    Alcohol abuse Paternal Grandfather    Liver cancer Paternal Grandfather    Social History   Social History Narrative   Marital status/children/pets: married, G2P2   Education/employment: B.A.   Safety:      -smoke alarm in the home:Yes     - wears seatbelt: Yes     - Feels safe in their relationships: Yes       Allergies as of 09/19/2022       Reactions   Bee Venom Anaphylaxis   Daypro [oxaprozin] Rash   Lodine [etodolac] Rash        Medication List        Accurate as of September 19, 2022  9:23 AM. If you have any questions, ask your nurse or doctor.          STOP taking these medications    sucralfate 1 g tablet Commonly known as: Carafate Stopped by: Felix Pacini, DO       TAKE these medications    albuterol 108 (90 Base) MCG/ACT inhaler Commonly known as: VENTOLIN HFA Inhale 1-2 puffs into the lungs every 6 (six) hours as needed for wheezing or shortness of breath.   ALPRAZolam 0.5 MG tablet Commonly known as: XANAX Take 1 tablet (0.5 mg total) by mouth daily as needed for anxiety.   BENADRYL ALLERGY PO Take by mouth.   cetirizine-pseudoephedrine 5-120 MG tablet Commonly known as: ZYRTEC-D Take by mouth.   Daily Vites tablet Take 1 tablet by mouth daily.   escitalopram 10 MG tablet Commonly known as: Lexapro Take 1 tablet (10 mg total) by mouth daily.   HM 4X Probiotic Tabs Take by mouth.   pantoprazole 20 MG tablet Commonly known as: Protonix Take 1 tablet (20 mg total) by mouth daily.        All past medical history, surgical history, allergies, family history,  immunizations andmedications were updated in the EMR today and reviewed under the history and medication portions of their EMR.     No results found for this or any previous visit (from the past 2160 hour(s)).  No results found.   ROS: 14 pt review of systems performed and negative (unless mentioned in an HPI)  Objective: BP 119/76   Pulse 72  Temp 98 F (36.7 C)   Ht 5' 3.58" (1.615 m)   Wt 179 lb 9.6 oz (81.5 kg)   SpO2 97%   BMI 31.23 kg/m  Physical Exam Vitals and nursing note reviewed.  Constitutional:      General: She is not in acute distress.    Appearance: Normal appearance. She is not ill-appearing or toxic-appearing.  HENT:     Head: Normocephalic and atraumatic.     Right Ear: Tympanic membrane, ear canal and external ear normal. There is no impacted cerumen.     Left Ear: Tympanic membrane, ear canal and external ear normal. There is no impacted cerumen.     Nose: No congestion or rhinorrhea.     Mouth/Throat:     Mouth: Mucous membranes are moist.     Pharynx: Oropharynx is clear. No oropharyngeal exudate or posterior oropharyngeal erythema.  Eyes:     General: No scleral icterus.       Right eye: No discharge.        Left eye: No discharge.     Extraocular Movements: Extraocular movements intact.     Conjunctiva/sclera: Conjunctivae normal.     Pupils: Pupils are equal, round, and reactive to light.  Cardiovascular:     Rate and Rhythm: Normal rate and regular rhythm.     Pulses: Normal pulses.     Heart sounds: Normal heart sounds. No murmur heard.    No friction rub. No gallop.  Pulmonary:     Effort: Pulmonary effort is normal. No respiratory distress.     Breath sounds: Normal breath sounds. No stridor. No wheezing, rhonchi or rales.  Chest:     Chest wall: No tenderness.  Abdominal:     General: Abdomen is flat. Bowel sounds are normal. There is no distension.     Palpations: Abdomen is soft. There is no mass.     Tenderness: There is no  abdominal tenderness. There is no right CVA tenderness, left CVA tenderness, guarding or rebound.     Hernia: No hernia is present.  Musculoskeletal:        General: No swelling, tenderness or deformity. Normal range of motion.     Cervical back: Normal range of motion and neck supple. No rigidity or tenderness.     Right lower leg: No edema.     Left lower leg: No edema.  Lymphadenopathy:     Cervical: No cervical adenopathy.  Skin:    General: Skin is warm and dry.     Coloration: Skin is not jaundiced or pale.     Findings: No bruising, erythema, lesion or rash.  Neurological:     General: No focal deficit present.     Mental Status: She is alert and oriented to person, place, and time. Mental status is at baseline.     Cranial Nerves: No cranial nerve deficit.     Sensory: No sensory deficit.     Motor: No weakness.     Coordination: Coordination normal.     Gait: Gait normal.     Deep Tendon Reflexes: Reflexes normal.  Psychiatric:        Mood and Affect: Mood normal.        Behavior: Behavior normal.        Thought Content: Thought content normal.        Judgment: Judgment normal.     No results found.  Assessment/plan: Tina Hood is a 51 y.o. female present for CPE/cmc Anxiety/Panic disorder (episodic paroxysmal anxiety)  stable Patient declining referral to therapist, at least for now. Continue  Lexapro 10 mg  Continue Xanax 0.5 mg daily as needed-face-to-face visit needed for any refills. Follow-up in 5.5 mos cmc  Patient was encouraged to exercise greater than 150 minutes a week. Patient was encouraged to choose a diet filled with fresh fruits and vegetables, and lean meats. AVS provided to patient today for education/recommendation on gender specific health and safety maintenance. Colonoscopy: scheduled for 5/4//2023. [facility]- Dr. Therisa Doyne- 10 yr Mammogram: completed: 02/2022 [facility] [address]> ordered for 2024 Cervical cancer screening: last pap: 09/2020-  WNL-neg HPV Immunizations: tdap UTD 2015, Influenza UTD 29562 (encouraged yearly), shingrix series completed. Covid series completed.  Infectious disease screening: HIV and Hep C completed DEXA: routine screen  Return in about 25 weeks (around 03/13/2023) for Routine chronic condition follow-up.  Orders Placed This Encounter  Procedures   MM 3D SCREEN BREAST BILATERAL   CBC with Differential/Platelet   Comprehensive metabolic panel   Hemoglobin A1c   Lipid panel   TSH    Meds ordered this encounter  Medications   albuterol (VENTOLIN HFA) 108 (90 Base) MCG/ACT inhaler    Sig: Inhale 1-2 puffs into the lungs every 6 (six) hours as needed for wheezing or shortness of breath.    Dispense:  8 g    Refill:  1   ALPRAZolam (XANAX) 0.5 MG tablet    Sig: Take 1 tablet (0.5 mg total) by mouth daily as needed for anxiety.    Dispense:  90 tablet    Refill:  1   escitalopram (LEXAPRO) 10 MG tablet    Sig: Take 1 tablet (10 mg total) by mouth daily.    Dispense:  90 tablet    Refill:  2   pantoprazole (PROTONIX) 20 MG tablet    Sig: Take 1 tablet (20 mg total) by mouth daily.    Dispense:  90 tablet    Refill:  3    Referral Orders  No referral(s) requested today     Electronically signed by: Howard Pouch, [place]

## 2022-10-02 DIAGNOSIS — M5386 Other specified dorsopathies, lumbar region: Secondary | ICD-10-CM | POA: Diagnosis not present

## 2022-10-02 DIAGNOSIS — M531 Cervicobrachial syndrome: Secondary | ICD-10-CM | POA: Diagnosis not present

## 2022-10-02 DIAGNOSIS — M9904 Segmental and somatic dysfunction of sacral region: Secondary | ICD-10-CM | POA: Diagnosis not present

## 2022-10-02 DIAGNOSIS — M9903 Segmental and somatic dysfunction of lumbar region: Secondary | ICD-10-CM | POA: Diagnosis not present

## 2022-11-28 DIAGNOSIS — M5386 Other specified dorsopathies, lumbar region: Secondary | ICD-10-CM | POA: Diagnosis not present

## 2022-11-28 DIAGNOSIS — M9903 Segmental and somatic dysfunction of lumbar region: Secondary | ICD-10-CM | POA: Diagnosis not present

## 2022-11-28 DIAGNOSIS — M531 Cervicobrachial syndrome: Secondary | ICD-10-CM | POA: Diagnosis not present

## 2022-11-28 DIAGNOSIS — M9904 Segmental and somatic dysfunction of sacral region: Secondary | ICD-10-CM | POA: Diagnosis not present

## 2022-12-23 DIAGNOSIS — M5386 Other specified dorsopathies, lumbar region: Secondary | ICD-10-CM | POA: Diagnosis not present

## 2022-12-23 DIAGNOSIS — M9903 Segmental and somatic dysfunction of lumbar region: Secondary | ICD-10-CM | POA: Diagnosis not present

## 2022-12-23 DIAGNOSIS — M9904 Segmental and somatic dysfunction of sacral region: Secondary | ICD-10-CM | POA: Diagnosis not present

## 2022-12-23 DIAGNOSIS — M531 Cervicobrachial syndrome: Secondary | ICD-10-CM | POA: Diagnosis not present

## 2022-12-30 DIAGNOSIS — M531 Cervicobrachial syndrome: Secondary | ICD-10-CM | POA: Diagnosis not present

## 2022-12-30 DIAGNOSIS — M5386 Other specified dorsopathies, lumbar region: Secondary | ICD-10-CM | POA: Diagnosis not present

## 2022-12-30 DIAGNOSIS — M9904 Segmental and somatic dysfunction of sacral region: Secondary | ICD-10-CM | POA: Diagnosis not present

## 2022-12-30 DIAGNOSIS — M9903 Segmental and somatic dysfunction of lumbar region: Secondary | ICD-10-CM | POA: Diagnosis not present

## 2023-01-06 DIAGNOSIS — M9903 Segmental and somatic dysfunction of lumbar region: Secondary | ICD-10-CM | POA: Diagnosis not present

## 2023-01-06 DIAGNOSIS — M9904 Segmental and somatic dysfunction of sacral region: Secondary | ICD-10-CM | POA: Diagnosis not present

## 2023-01-06 DIAGNOSIS — M531 Cervicobrachial syndrome: Secondary | ICD-10-CM | POA: Diagnosis not present

## 2023-01-06 DIAGNOSIS — M5386 Other specified dorsopathies, lumbar region: Secondary | ICD-10-CM | POA: Diagnosis not present

## 2023-01-20 DIAGNOSIS — M5386 Other specified dorsopathies, lumbar region: Secondary | ICD-10-CM | POA: Diagnosis not present

## 2023-01-20 DIAGNOSIS — M531 Cervicobrachial syndrome: Secondary | ICD-10-CM | POA: Diagnosis not present

## 2023-01-20 DIAGNOSIS — M9903 Segmental and somatic dysfunction of lumbar region: Secondary | ICD-10-CM | POA: Diagnosis not present

## 2023-01-20 DIAGNOSIS — M9904 Segmental and somatic dysfunction of sacral region: Secondary | ICD-10-CM | POA: Diagnosis not present

## 2023-01-21 IMAGING — DX DG CHEST 2V
2 series · 2 of 2 positions shown · non-contrast
Comparison: None Available.

CLINICAL DATA: Cough and wheezing.  Postop for 1 week.

EXAM:
CHEST - 2 VIEW

[chest pa]
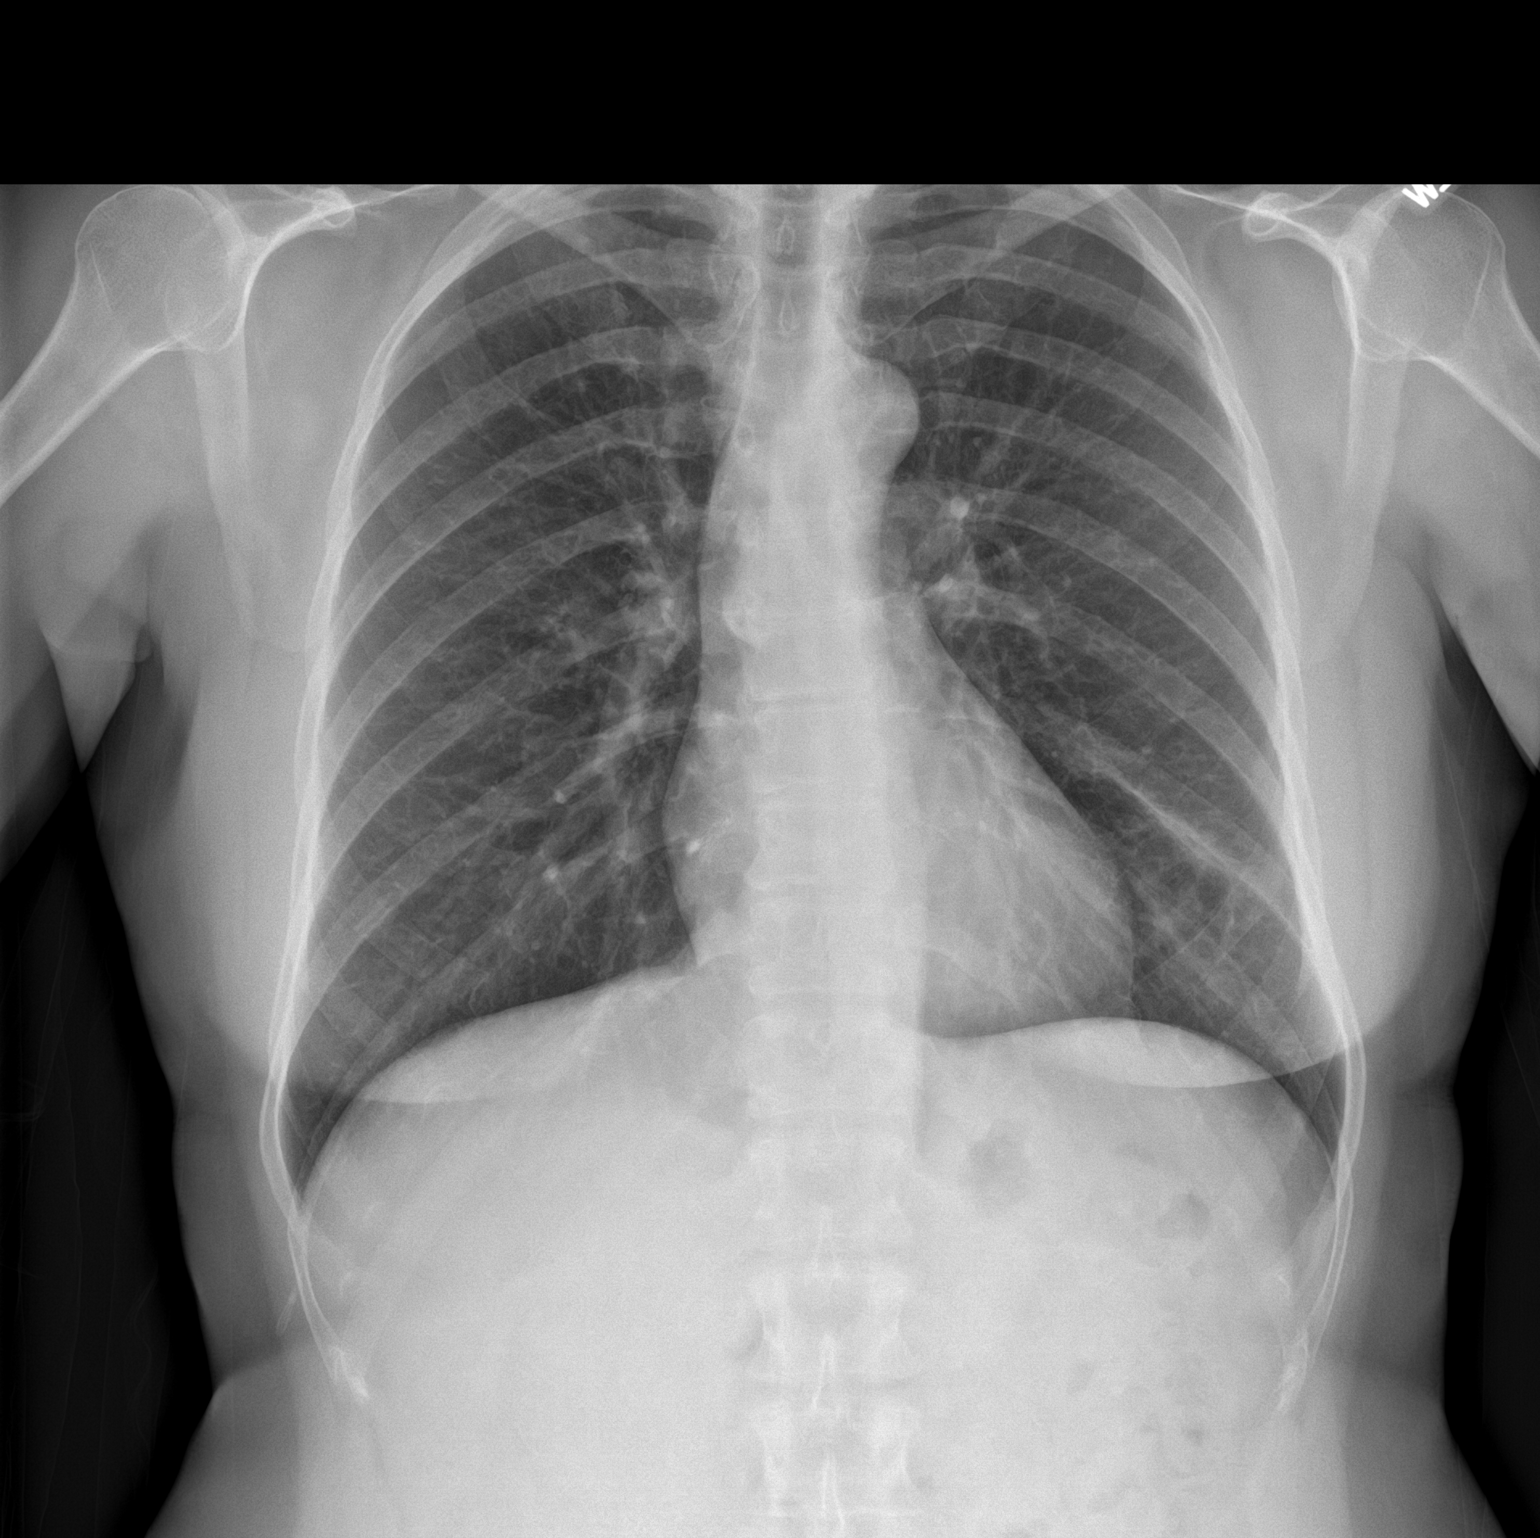

[chest lat]
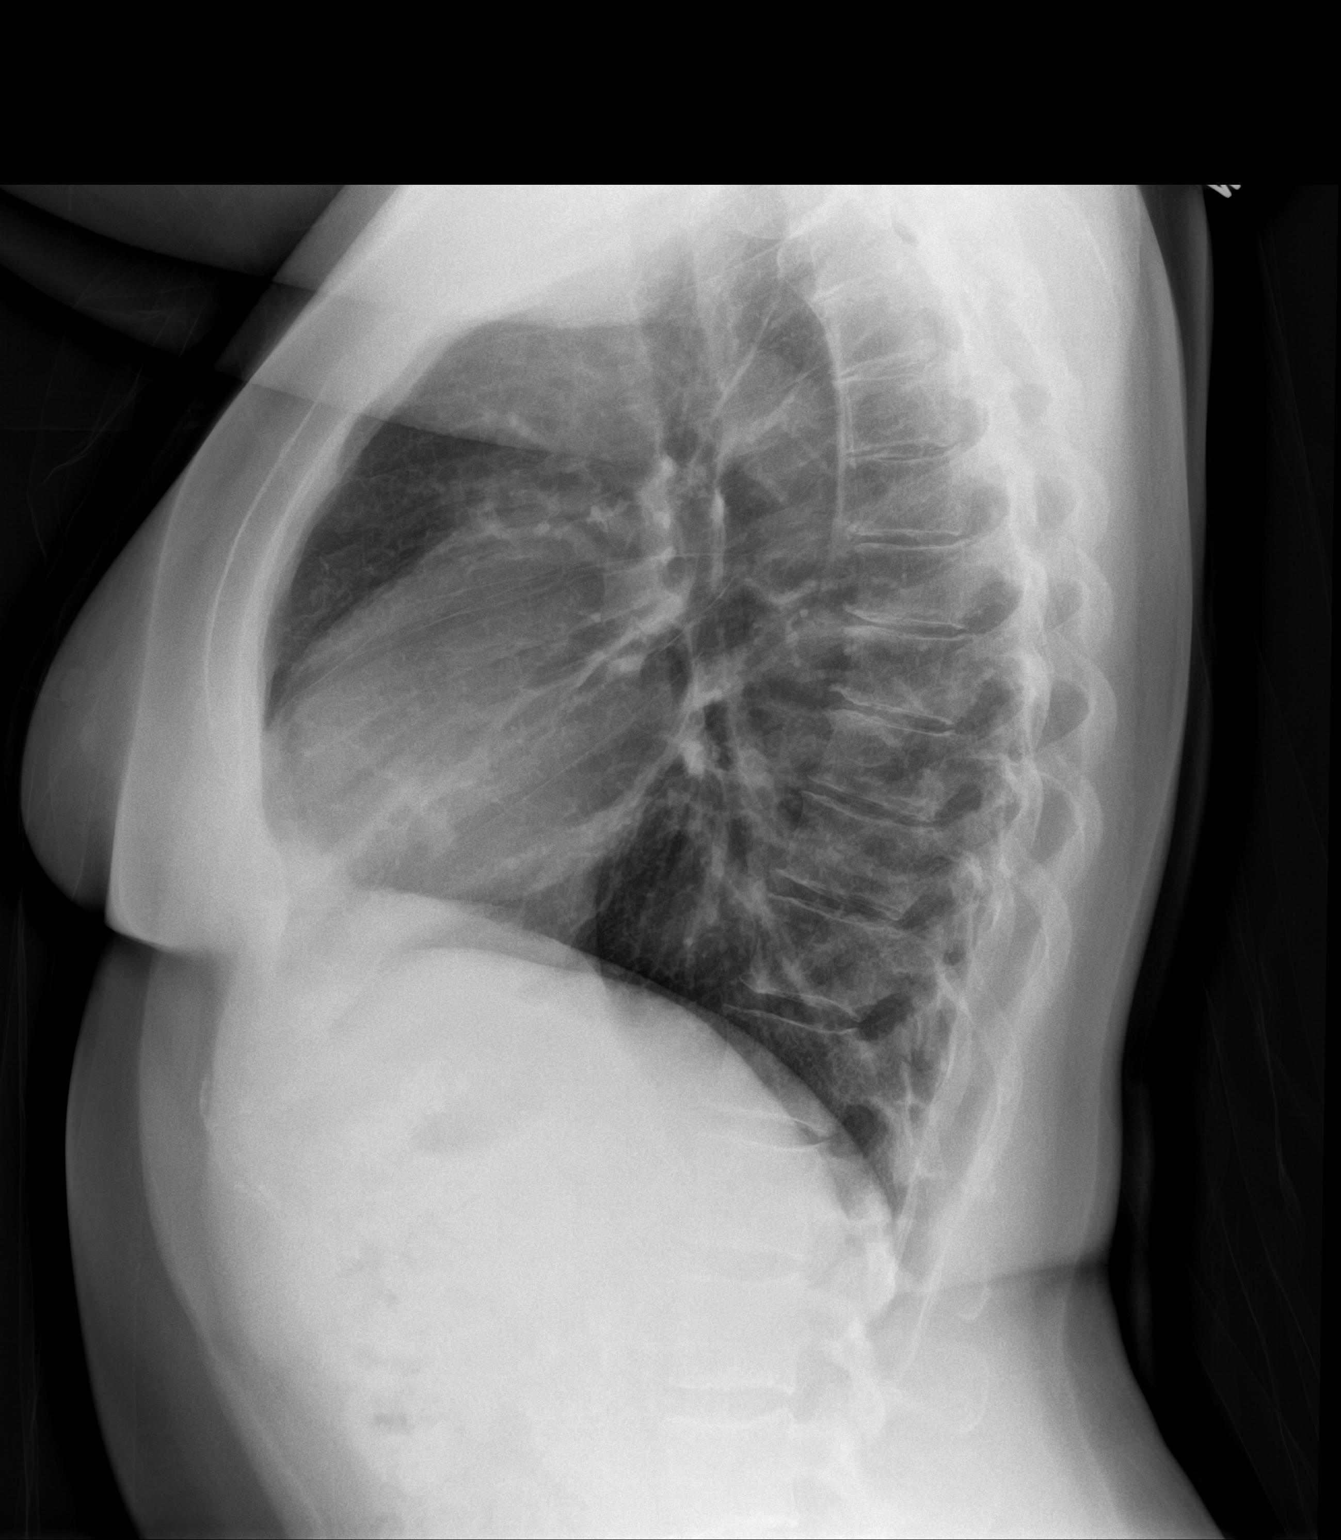

[2 of 2 positions shown; findings below may reference images not displayed]

FINDINGS: The heart size and mediastinal contours are within normal limits.
Band like opacity is seen in the left lower lung, which may be due
to scarring or atelectasis. No evidence of pulmonary edema or
consolidation. No evidence of pleural effusion. The visualized
skeletal structures are unremarkable.
IMPRESSION: Mild left lower lung scarring versus atelectasis. Otherwise
unremarkable.

## 2023-02-03 DIAGNOSIS — M9904 Segmental and somatic dysfunction of sacral region: Secondary | ICD-10-CM | POA: Diagnosis not present

## 2023-02-03 DIAGNOSIS — M5386 Other specified dorsopathies, lumbar region: Secondary | ICD-10-CM | POA: Diagnosis not present

## 2023-02-03 DIAGNOSIS — M531 Cervicobrachial syndrome: Secondary | ICD-10-CM | POA: Diagnosis not present

## 2023-02-03 DIAGNOSIS — M9903 Segmental and somatic dysfunction of lumbar region: Secondary | ICD-10-CM | POA: Diagnosis not present

## 2023-02-17 DIAGNOSIS — M9904 Segmental and somatic dysfunction of sacral region: Secondary | ICD-10-CM | POA: Diagnosis not present

## 2023-02-17 DIAGNOSIS — M9903 Segmental and somatic dysfunction of lumbar region: Secondary | ICD-10-CM | POA: Diagnosis not present

## 2023-02-17 DIAGNOSIS — M5386 Other specified dorsopathies, lumbar region: Secondary | ICD-10-CM | POA: Diagnosis not present

## 2023-02-17 DIAGNOSIS — M531 Cervicobrachial syndrome: Secondary | ICD-10-CM | POA: Diagnosis not present

## 2023-02-20 DIAGNOSIS — Z1231 Encounter for screening mammogram for malignant neoplasm of breast: Secondary | ICD-10-CM | POA: Diagnosis not present

## 2023-02-20 LAB — HM MAMMOGRAPHY

## 2023-02-23 ENCOUNTER — Telehealth: Payer: Self-pay

## 2023-03-10 DIAGNOSIS — M9903 Segmental and somatic dysfunction of lumbar region: Secondary | ICD-10-CM | POA: Diagnosis not present

## 2023-03-10 DIAGNOSIS — M9904 Segmental and somatic dysfunction of sacral region: Secondary | ICD-10-CM | POA: Diagnosis not present

## 2023-03-10 DIAGNOSIS — M531 Cervicobrachial syndrome: Secondary | ICD-10-CM | POA: Diagnosis not present

## 2023-03-10 DIAGNOSIS — M5386 Other specified dorsopathies, lumbar region: Secondary | ICD-10-CM | POA: Diagnosis not present

## 2023-03-18 ENCOUNTER — Ambulatory Visit: Payer: BC Managed Care – PPO | Admitting: Family Medicine

## 2023-03-31 DIAGNOSIS — M5386 Other specified dorsopathies, lumbar region: Secondary | ICD-10-CM | POA: Diagnosis not present

## 2023-03-31 DIAGNOSIS — M9903 Segmental and somatic dysfunction of lumbar region: Secondary | ICD-10-CM | POA: Diagnosis not present

## 2023-03-31 DIAGNOSIS — M9904 Segmental and somatic dysfunction of sacral region: Secondary | ICD-10-CM | POA: Diagnosis not present

## 2023-03-31 DIAGNOSIS — M531 Cervicobrachial syndrome: Secondary | ICD-10-CM | POA: Diagnosis not present

## 2023-04-07 ENCOUNTER — Encounter: Payer: Self-pay | Admitting: Family Medicine

## 2023-04-07 ENCOUNTER — Ambulatory Visit (INDEPENDENT_AMBULATORY_CARE_PROVIDER_SITE_OTHER): Payer: BC Managed Care – PPO | Admitting: Family Medicine

## 2023-04-07 VITALS — BP 123/78 | HR 75 | Temp 98.0°F | Wt 180.2 lb

## 2023-04-07 DIAGNOSIS — F410 Panic disorder [episodic paroxysmal anxiety] without agoraphobia: Secondary | ICD-10-CM

## 2023-04-07 DIAGNOSIS — F419 Anxiety disorder, unspecified: Secondary | ICD-10-CM

## 2023-04-07 MED ORDER — ESCITALOPRAM OXALATE 10 MG PO TABS: 10.0000 mg | ORAL_TABLET | Freq: Every day | ORAL | 2 refills | Status: DC

## 2023-04-07 MED ORDER — ALPRAZOLAM 0.5 MG PO TABS: 0.5000 mg | ORAL_TABLET | Freq: Every day | ORAL | 1 refills | Status: DC | PRN

## 2023-04-07 NOTE — Progress Notes (Signed)
Patient ID: Tina Hood, female  DOB: 24-Jan-1971, 52 y.o.   MRN: [id number] Patient Care Team    Relationship Specialty Notifications Start End  Natalia Leatherwood, DO PCP - General Family Medicine  06/19/21   Melina Fiddler, MD Consulting Physician Sports Medicine  06/24/21   Marlow Baars, MD Consulting Physician Obstetrics  06/24/21    Comment: [facility]  Lenn Sink, [place] Consulting Physician Podiatry  06/24/21   Scheryl Marten, MD  Family Medicine  10/24/21     Chief Complaint  Patient presents with   Anxiety    Subjective: Tina Hood is a 52 y.o.  Female  present for chronic condition management All past medical history, surgical history, allergies, family history, immunizations, medications and social history were updated in the electronic medical record today. All recent labs, ED visits and hospitalizations within the last year were reviewed.  Anxiety/panic: Pt reports compliance with lexapro 10 mg and feels it is working well.  She does have a prescription for Xanax in which she uses rarely for panic attacks. Prior note Patient reports she has had anxiety and panic attacks since she was in high school.  She has been prescribed Xanax 0.5 mg daily as needed for panic for many years.  She states she uses this medication infrequently.  Secondary to her mother's health is causing her added stress.         04/07/2023    9:47 AM 09/19/2022    9:14 AM 03/04/2022   10:53 AM 09/17/2021   10:12 AM 06/24/2021    1:38 PM  Depression screen PHQ 2/9  Decreased Interest 0 0 0 1 1  Down, Depressed, Hopeless 0 0 0 0 3  PHQ - 2 Score 0 0 0 1 4  Altered sleeping  2 1 1  0  Tired, decreased energy  1 0 0 0  Change in appetite   0 0 1  Feeling bad or failure about yourself   0 0 0 1  Trouble concentrating   0 2 0  Moving slowly or fidgety/restless  0 0 0 0  Suicidal thoughts  0 0 0 0  PHQ-9 Score  3 1 4 6       04/07/2023    9:47 AM 09/19/2022    9:14 AM  03/04/2022   10:53 AM 09/17/2021   10:12 AM  GAD 7 : Generalized Anxiety Score  Nervous, Anxious, on Edge 1 1 1 2   Control/stop worrying 0 1 1 2   Worry too much - different things 1 1 2 2   Trouble relaxing 1 1 1 1   Restless 1 0 2 1  Easily annoyed or irritable 1 0 0 1  Afraid - awful might happen 0 1 1 0  Total GAD 7 Score 5 5 8 9   Anxiety Difficulty Not difficult at all       Immunization History  Administered Date(s) Administered   Influenza Inj Mdck Quad Pf 07/28/2022   Influenza Split 08/08/2009, 08/03/2010, 08/04/2012, 08/03/2013, 07/12/2020   Influenza,inj,Quad PF,6+ Mos 09/07/2014, 08/22/2015, 09/23/2016   Influenza-Unspecified 08/08/2009, 08/03/2010, 08/04/2012, 08/03/2013, 07/12/2020, 09/02/2021   PFIZER(Purple Top)SARS-COV-2 Vaccination 01/27/2020, 02/17/2020, 10/06/2020   Tdap 10/04/2003, 11/22/2013   Zoster Recombinat (Shingrix) 06/24/2021, 09/17/2021   Past Medical History:  Diagnosis Date   Anxiety    Arthritis    Horse bite 01/27/2021   followed by gyn   Leukopenia    mild lower WBC chronically - stable   Migraines  Allergies  Allergen Reactions   Bee Venom Anaphylaxis   Daypro [Oxaprozin] Rash   Lodine [Etodolac] Rash   Past Surgical History:  Procedure Laterality Date   CYSTECTOMY     GANGLION CYST EXCISION     Family History  Problem Relation Age of Onset   Hyperlipidemia Mother    Hypertension Mother    Arthritis Mother    Skin cancer Mother    Hearing loss Mother    Atrial fibrillation Mother    Hypertension Father    Hyperlipidemia Father    Hearing loss Father    Arthritis Father    Prostate cancer Father    Asthma Sister    Arthritis Sister    Miscarriages / [place] Sister    Miscarriages / [place] Sister    Arthritis Sister    Hearing loss Maternal Grandmother    Arthritis Maternal Grandmother    Early death Paternal Grandmother    Alcohol abuse Paternal Grandfather    Liver cancer Paternal Grandfather    Social  History   Social History Narrative   Marital status/children/pets: married, G2P2   Education/employment: B.A.   Safety:      -smoke alarm in the home:Yes     - wears seatbelt: Yes     - Feels safe in their relationships: Yes       Allergies as of 04/07/2023       Reactions   Bee Venom Anaphylaxis   Daypro [oxaprozin] Rash   Lodine [etodolac] Rash        Medication List        Accurate as of April 07, 2023 10:11 AM. If you have any questions, ask your nurse or doctor.          STOP taking these medications    Daily Vites tablet Stopped by: Felix Pacini, DO       TAKE these medications    albuterol 108 (90 Base) MCG/ACT inhaler Commonly known as: VENTOLIN HFA Inhale 1-2 puffs into the lungs every 6 (six) hours as needed for wheezing or shortness of breath.   ALPRAZolam 0.5 MG tablet Commonly known as: XANAX Take 1 tablet (0.5 mg total) by mouth daily as needed for anxiety.   BENADRYL ALLERGY PO Take by mouth.   cetirizine-pseudoephedrine 5-120 MG tablet Commonly known as: ZYRTEC-D Take by mouth.   escitalopram 10 MG tablet Commonly known as: Lexapro Take 1 tablet (10 mg total) by mouth daily.   HM 4X Probiotic Tabs Take by mouth.   OVER THE COUNTER MEDICATION AG1   pantoprazole 20 MG tablet Commonly known as: Protonix Take 1 tablet (20 mg total) by mouth daily. What changed:  when to take this reasons to take this        All past medical history, surgical history, allergies, family history, immunizations andmedications were updated in the EMR today and reviewed under the history and medication portions of their EMR.     Recent Results (from the past 2160 hour(s))  HM MAMMOGRAPHY     Status: None   Collection Time: 02/20/23  4:01 PM  Result Value Ref Range   HM Mammogram 0-4 Bi-Rad 0-4 Bi-Rad, Self Reported Normal    No results found.   ROS: 14 pt review of systems performed and negative (unless mentioned in an HPI)  Objective: BP  123/78   Pulse 75   Temp 98 F (36.7 C)   Wt 180 lb 3.2 oz (81.7 kg)   SpO2 96%   BMI 31.34 kg/m  Physical Exam Vitals and nursing note reviewed.  Constitutional:      General: She is not in acute distress.    Appearance: Normal appearance. She is normal weight. She is not ill-appearing or toxic-appearing.  HENT:     Head: Normocephalic and atraumatic.  Eyes:     General: No scleral icterus.       Right eye: No discharge.        Left eye: No discharge.     Extraocular Movements: Extraocular movements intact.     Conjunctiva/sclera: Conjunctivae normal.     Pupils: Pupils are equal, round, and reactive to light.  Skin:    Findings: No rash.  Neurological:     Mental Status: She is alert and oriented to person, place, and time. Mental status is at baseline.     Motor: No weakness.     Coordination: Coordination normal.     Gait: Gait normal.  Psychiatric:        Mood and Affect: Mood normal.        Behavior: Behavior normal.        Thought Content: Thought content normal.        Judgment: Judgment normal.     No results found.  Assessment/plan: Tina Hood is a 52 y.o. female present for chronic condition management Anxiety/Panic disorder (episodic paroxysmal anxiety) Stable Patient declining referral to therapist, at least for now. Continue Lexapro 10 mg  Continue Xanax 0.5 mg daily as needed-face-to-face visit needed for any refills.  [place] controlled substance database reviewed today and appropriate Follow-up ~11/18 for cpe/cmc combo appt   Return in about 5 months (around 09/21/2023) for cpe (20 min), Routine chronic condition follow-up.  No orders of the defined types were placed in this encounter.   Meds ordered this encounter  Medications   ALPRAZolam (XANAX) 0.5 MG tablet    Sig: Take 1 tablet (0.5 mg total) by mouth daily as needed for anxiety.    Dispense:  90 tablet    Refill:  1   escitalopram (LEXAPRO) 10 MG tablet    Sig: Take 1  tablet (10 mg total) by mouth daily.    Dispense:  90 tablet    Refill:  2    Referral Orders  No referral(s) requested today     Electronically signed by: Felix Pacini, DO [facility] [place]

## 2023-04-07 NOTE — Patient Instructions (Signed)
Return in about 5 months (around 09/21/2023) for cpe (20 min), Routine chronic condition follow-up.        Great to see you today.  I have refilled the medication(s) we provide.   If labs were collected, we will inform you of lab results once received either by echart message or telephone call.   - echart message- for normal results that have been seen by the patient already.   - telephone call: abnormal results or if patient has not viewed results in their echart.

## 2023-04-08 ENCOUNTER — Ambulatory Visit: Payer: BC Managed Care – PPO | Admitting: Family Medicine

## 2023-04-28 DIAGNOSIS — M9904 Segmental and somatic dysfunction of sacral region: Secondary | ICD-10-CM | POA: Diagnosis not present

## 2023-04-28 DIAGNOSIS — M531 Cervicobrachial syndrome: Secondary | ICD-10-CM | POA: Diagnosis not present

## 2023-04-28 DIAGNOSIS — M9903 Segmental and somatic dysfunction of lumbar region: Secondary | ICD-10-CM | POA: Diagnosis not present

## 2023-04-28 DIAGNOSIS — M5386 Other specified dorsopathies, lumbar region: Secondary | ICD-10-CM | POA: Diagnosis not present

## 2023-05-12 DIAGNOSIS — M9904 Segmental and somatic dysfunction of sacral region: Secondary | ICD-10-CM | POA: Diagnosis not present

## 2023-05-12 DIAGNOSIS — M5386 Other specified dorsopathies, lumbar region: Secondary | ICD-10-CM | POA: Diagnosis not present

## 2023-05-12 DIAGNOSIS — M531 Cervicobrachial syndrome: Secondary | ICD-10-CM | POA: Diagnosis not present

## 2023-05-12 DIAGNOSIS — M9903 Segmental and somatic dysfunction of lumbar region: Secondary | ICD-10-CM | POA: Diagnosis not present

## 2023-05-19 DIAGNOSIS — Z6831 Body mass index (BMI) 31.0-31.9, adult: Secondary | ICD-10-CM | POA: Diagnosis not present

## 2023-05-19 DIAGNOSIS — Z01419 Encounter for gynecological examination (general) (routine) without abnormal findings: Secondary | ICD-10-CM | POA: Diagnosis not present

## 2023-06-02 DIAGNOSIS — M5386 Other specified dorsopathies, lumbar region: Secondary | ICD-10-CM | POA: Diagnosis not present

## 2023-06-02 DIAGNOSIS — M9904 Segmental and somatic dysfunction of sacral region: Secondary | ICD-10-CM | POA: Diagnosis not present

## 2023-06-02 DIAGNOSIS — M531 Cervicobrachial syndrome: Secondary | ICD-10-CM | POA: Diagnosis not present

## 2023-06-02 DIAGNOSIS — M9903 Segmental and somatic dysfunction of lumbar region: Secondary | ICD-10-CM | POA: Diagnosis not present

## 2023-06-25 DIAGNOSIS — R0989 Other specified symptoms and signs involving the circulatory and respiratory systems: Secondary | ICD-10-CM | POA: Diagnosis not present

## 2023-06-25 DIAGNOSIS — R1319 Other dysphagia: Secondary | ICD-10-CM | POA: Diagnosis not present

## 2023-06-30 DIAGNOSIS — M5386 Other specified dorsopathies, lumbar region: Secondary | ICD-10-CM | POA: Diagnosis not present

## 2023-06-30 DIAGNOSIS — M9903 Segmental and somatic dysfunction of lumbar region: Secondary | ICD-10-CM | POA: Diagnosis not present

## 2023-06-30 DIAGNOSIS — M9904 Segmental and somatic dysfunction of sacral region: Secondary | ICD-10-CM | POA: Diagnosis not present

## 2023-06-30 DIAGNOSIS — M531 Cervicobrachial syndrome: Secondary | ICD-10-CM | POA: Diagnosis not present

## 2023-07-14 DIAGNOSIS — M5386 Other specified dorsopathies, lumbar region: Secondary | ICD-10-CM | POA: Diagnosis not present

## 2023-07-14 DIAGNOSIS — M9904 Segmental and somatic dysfunction of sacral region: Secondary | ICD-10-CM | POA: Diagnosis not present

## 2023-07-14 DIAGNOSIS — M9903 Segmental and somatic dysfunction of lumbar region: Secondary | ICD-10-CM | POA: Diagnosis not present

## 2023-07-14 DIAGNOSIS — M531 Cervicobrachial syndrome: Secondary | ICD-10-CM | POA: Diagnosis not present

## 2023-07-28 DIAGNOSIS — M9903 Segmental and somatic dysfunction of lumbar region: Secondary | ICD-10-CM | POA: Diagnosis not present

## 2023-07-28 DIAGNOSIS — M9904 Segmental and somatic dysfunction of sacral region: Secondary | ICD-10-CM | POA: Diagnosis not present

## 2023-07-28 DIAGNOSIS — M5386 Other specified dorsopathies, lumbar region: Secondary | ICD-10-CM | POA: Diagnosis not present

## 2023-07-28 DIAGNOSIS — M531 Cervicobrachial syndrome: Secondary | ICD-10-CM | POA: Diagnosis not present

## 2023-08-25 DIAGNOSIS — M5386 Other specified dorsopathies, lumbar region: Secondary | ICD-10-CM | POA: Diagnosis not present

## 2023-08-25 DIAGNOSIS — M9903 Segmental and somatic dysfunction of lumbar region: Secondary | ICD-10-CM | POA: Diagnosis not present

## 2023-08-25 DIAGNOSIS — M9904 Segmental and somatic dysfunction of sacral region: Secondary | ICD-10-CM | POA: Diagnosis not present

## 2023-08-25 DIAGNOSIS — M531 Cervicobrachial syndrome: Secondary | ICD-10-CM | POA: Diagnosis not present

## 2023-08-27 DIAGNOSIS — K222 Esophageal obstruction: Secondary | ICD-10-CM | POA: Diagnosis not present

## 2023-08-27 DIAGNOSIS — K3189 Other diseases of stomach and duodenum: Secondary | ICD-10-CM | POA: Diagnosis not present

## 2023-08-27 DIAGNOSIS — R1319 Other dysphagia: Secondary | ICD-10-CM | POA: Diagnosis not present

## 2023-08-27 DIAGNOSIS — K449 Diaphragmatic hernia without obstruction or gangrene: Secondary | ICD-10-CM | POA: Diagnosis not present

## 2023-08-27 DIAGNOSIS — K2950 Unspecified chronic gastritis without bleeding: Secondary | ICD-10-CM | POA: Diagnosis not present

## 2023-09-22 ENCOUNTER — Encounter: Payer: BC Managed Care – PPO | Admitting: Family Medicine

## 2023-09-22 DIAGNOSIS — M9903 Segmental and somatic dysfunction of lumbar region: Secondary | ICD-10-CM | POA: Diagnosis not present

## 2023-09-22 DIAGNOSIS — M9904 Segmental and somatic dysfunction of sacral region: Secondary | ICD-10-CM | POA: Diagnosis not present

## 2023-09-22 DIAGNOSIS — M5386 Other specified dorsopathies, lumbar region: Secondary | ICD-10-CM | POA: Diagnosis not present

## 2023-09-22 DIAGNOSIS — M531 Cervicobrachial syndrome: Secondary | ICD-10-CM | POA: Diagnosis not present

## 2023-10-06 DIAGNOSIS — M5386 Other specified dorsopathies, lumbar region: Secondary | ICD-10-CM | POA: Diagnosis not present

## 2023-10-06 DIAGNOSIS — M9904 Segmental and somatic dysfunction of sacral region: Secondary | ICD-10-CM | POA: Diagnosis not present

## 2023-10-06 DIAGNOSIS — M9903 Segmental and somatic dysfunction of lumbar region: Secondary | ICD-10-CM | POA: Diagnosis not present

## 2023-10-06 DIAGNOSIS — M531 Cervicobrachial syndrome: Secondary | ICD-10-CM | POA: Diagnosis not present

## 2023-10-14 ENCOUNTER — Encounter: Payer: Self-pay | Admitting: Family Medicine

## 2023-10-14 ENCOUNTER — Ambulatory Visit: Payer: BC Managed Care – PPO | Admitting: Family Medicine

## 2023-10-14 VITALS — BP 110/70 | HR 84 | Temp 98.1°F | Ht 64.17 in | Wt 180.4 lb

## 2023-10-14 DIAGNOSIS — F410 Panic disorder [episodic paroxysmal anxiety] without agoraphobia: Secondary | ICD-10-CM

## 2023-10-14 DIAGNOSIS — K219 Gastro-esophageal reflux disease without esophagitis: Secondary | ICD-10-CM

## 2023-10-14 DIAGNOSIS — F419 Anxiety disorder, unspecified: Secondary | ICD-10-CM | POA: Diagnosis not present

## 2023-10-14 MED ORDER — PANTOPRAZOLE SODIUM 20 MG PO TBEC
20.0000 mg | DELAYED_RELEASE_TABLET | Freq: Two times a day (BID) | ORAL | 1 refills | Status: DC
Start: 2023-10-14 — End: 2023-11-09

## 2023-10-14 MED ORDER — ALPRAZOLAM 0.5 MG PO TABS
0.5000 mg | ORAL_TABLET | Freq: Every day | ORAL | 1 refills | Status: DC | PRN
Start: 2023-10-14 — End: 2024-03-30

## 2023-10-14 MED ORDER — ESCITALOPRAM OXALATE 10 MG PO TABS
10.0000 mg | ORAL_TABLET | Freq: Every day | ORAL | 2 refills | Status: DC
Start: 2023-10-14 — End: 2024-03-31

## 2023-10-14 NOTE — Progress Notes (Signed)
Patient ID: Tina Hood, female  DOB: 1971/07/18, 52 y.o.   MRN: [id number] Patient Care Team    Relationship Specialty Notifications Start End  Natalia Leatherwood, DO PCP - General Family Medicine  06/19/21   Melina Fiddler, MD Consulting Physician Sports Medicine  06/24/21   Marlow Baars, MD Consulting Physician Obstetrics  06/24/21    Comment: [facility]  Lenn Sink, [place] Consulting Physician Podiatry  06/24/21   Scheryl Marten, MD  Family Medicine  10/24/21     Chief Complaint  Patient presents with   Anxiety    Subjective: Tina Hood is a 52 y.o.  Female  present for chronic condition management All past medical history, surgical history, allergies, family history, immunizations, medications and social history were updated in the electronic medical record today. All recent labs, ED visits and hospitalizations within the last year were reviewed.  Anxiety/panic: Pt reports compliance with lexapro 10 mg and feels it is working well She does have a prescription for Xanax in which she uses rarely for panic attacks. Prior note Patient reports she has had anxiety and panic attacks since she was in high school.  She has been prescribed Xanax 0.5 mg daily as needed for panic for many years.  She states she uses this medication infrequently.  Secondary to her mother's health is causing her added stress.         04/07/2023    9:47 AM 09/19/2022    9:14 AM 03/04/2022   10:53 AM 09/17/2021   10:12 AM 06/24/2021    1:38 PM  Depression screen PHQ 2/9  Decreased Interest 0 0 0 1 1  Down, Depressed, Hopeless 0 0 0 0 3  PHQ - 2 Score 0 0 0 1 4  Altered sleeping  2 1 1  0  Tired, decreased energy  1 0 0 0  Change in appetite   0 0 1  Feeling bad or failure about yourself   0 0 0 1  Trouble concentrating   0 2 0  Moving slowly or fidgety/restless  0 0 0 0  Suicidal thoughts  0 0 0 0  PHQ-9 Score  3 1 4 6       04/07/2023    9:47 AM 09/19/2022    9:14 AM  03/04/2022   10:53 AM 09/17/2021   10:12 AM  GAD 7 : Generalized Anxiety Score  Nervous, Anxious, on Edge 1 1 1 2   Control/stop worrying 0 1 1 2   Worry too much - different things 1 1 2 2   Trouble relaxing 1 1 1 1   Restless 1 0 2 1  Easily annoyed or irritable 1 0 0 1  Afraid - awful might happen 0 1 1 0  Total GAD 7 Score 5 5 8 9   Anxiety Difficulty Not difficult at all       Immunization History  Administered Date(s) Administered   Influenza Inj Mdck Quad Pf 07/28/2022   Influenza Split 08/08/2009, 08/03/2010, 08/04/2012, 08/03/2013, 07/12/2020   Influenza, Seasonal, Injecte, Preservative Fre 08/06/2023   Influenza,inj,Quad PF,6+ Mos 09/07/2014, 08/22/2015, 09/23/2016   Influenza-Unspecified 08/08/2009, 08/03/2010, 08/04/2012, 08/03/2013, 07/12/2020, 09/02/2021   PFIZER(Purple Top)SARS-COV-2 Vaccination 01/27/2020, 02/17/2020, 10/06/2020   Tdap 10/04/2003, 11/22/2013   Zoster Recombinant(Shingrix) 06/24/2021, 09/17/2021   Past Medical History:  Diagnosis Date   Anxiety    Arthritis    Horse bite 01/27/2021   followed by gyn   Leukopenia    mild lower WBC chronically -  stable   Migraines    Allergies  Allergen Reactions   Bee Venom Anaphylaxis   Daypro [Oxaprozin] Rash   Lodine [Etodolac] Rash   Past Surgical History:  Procedure Laterality Date   CYSTECTOMY     GANGLION CYST EXCISION     Family History  Problem Relation Age of Onset   Hyperlipidemia Mother    Hypertension Mother    Arthritis Mother    Skin cancer Mother    Hearing loss Mother    Atrial fibrillation Mother    Hypertension Father    Hyperlipidemia Father    Hearing loss Father    Arthritis Father    Prostate cancer Father    Asthma Sister    Arthritis Sister    Miscarriages / [place] Sister    Miscarriages / [place] Sister    Arthritis Sister    Hearing loss Maternal Grandmother    Arthritis Maternal Grandmother    Early death Paternal Grandmother    Alcohol abuse Paternal  Grandfather    Liver cancer Paternal Grandfather    Social History   Social History Narrative   Marital status/children/pets: married, G2P2   Education/employment: B.A.   Safety:      -smoke alarm in the home:Yes     - wears seatbelt: Yes     - Feels safe in their relationships: Yes       Allergies as of 10/14/2023       Reactions   Bee Venom Anaphylaxis   Daypro [oxaprozin] Rash   Lodine [etodolac] Rash        Medication List        Accurate as of October 14, 2023  1:20 PM. If you have any questions, ask your nurse or doctor.          STOP taking these medications    HM 4X Probiotic Tabs Stopped by: Felix Pacini       TAKE these medications    albuterol 108 (90 Base) MCG/ACT inhaler Commonly known as: VENTOLIN HFA Inhale 1-2 puffs into the lungs every 6 (six) hours as needed for wheezing or shortness of breath.   ALPRAZolam 0.5 MG tablet Commonly known as: XANAX Take 1 tablet (0.5 mg total) by mouth daily as needed for anxiety.   BENADRYL ALLERGY PO Take by mouth.   cetirizine-pseudoephedrine 5-120 MG tablet Commonly known as: ZYRTEC-D Take by mouth.   escitalopram 10 MG tablet Commonly known as: Lexapro Take 1 tablet (10 mg total) by mouth daily.   OVER THE COUNTER MEDICATION AG1 -probiotic   pantoprazole 20 MG tablet Commonly known as: Protonix Take 1 tablet (20 mg total) by mouth 2 (two) times daily.        All past medical history, surgical history, allergies, family history, immunizations andmedications were updated in the EMR today and reviewed under the history and medication portions of their EMR.     No results found for this or any previous visit (from the past 2160 hour(s)).   No results found.   ROS: 14 pt review of systems performed and negative (unless mentioned in an HPI)  Objective: BP 110/70   Pulse 84   Temp 98.1 F (36.7 C)   Ht 5' 4.17" (1.63 m)   Wt 180 lb 6.4 oz (81.8 kg)   SpO2 98%   BMI 30.80 kg/m   Physical Exam Vitals and nursing note reviewed.  Constitutional:      General: She is not in acute distress.    Appearance: Normal appearance.  She is normal weight. She is not ill-appearing or toxic-appearing.  HENT:     Head: Normocephalic and atraumatic.  Eyes:     General: No scleral icterus.       Right eye: No discharge.        Left eye: No discharge.     Extraocular Movements: Extraocular movements intact.     Conjunctiva/sclera: Conjunctivae normal.     Pupils: Pupils are equal, round, and reactive to light.  Skin:    Findings: No rash.  Neurological:     Mental Status: She is alert and oriented to person, place, and time. Mental status is at baseline.     Motor: No weakness.     Coordination: Coordination normal.     Gait: Gait normal.  Psychiatric:        Mood and Affect: Mood normal.        Behavior: Behavior normal.        Thought Content: Thought content normal.        Judgment: Judgment normal.     No results found.  Assessment/plan: Tina Hood is a 52 y.o. female present for chronic condition management Anxiety/Panic disorder (episodic paroxysmal anxiety) Stable Patient declining referral to therapist, at least for now. Continue Lexapro 10 mg  Continue Xanax 0.5 mg daily as needed-face-to-face visit needed for any refills.  [place] controlled substance database reviewed today and appropriate  GERD: GI increased to BID Protonix 20 mg> continue> refilled for her  Return in about 24 weeks (around 03/30/2024) for cpe (20 min), Routine chronic condition follow-up.  No orders of the defined types were placed in this encounter.   Meds ordered this encounter  Medications   ALPRAZolam (XANAX) 0.5 MG tablet    Sig: Take 1 tablet (0.5 mg total) by mouth daily as needed for anxiety.    Dispense:  90 tablet    Refill:  1   escitalopram (LEXAPRO) 10 MG tablet    Sig: Take 1 tablet (10 mg total) by mouth daily.    Dispense:  90 tablet    Refill:  2    pantoprazole (PROTONIX) 20 MG tablet    Sig: Take 1 tablet (20 mg total) by mouth 2 (two) times daily.    Dispense:  180 tablet    Refill:  1    Referral Orders  No referral(s) requested today     Electronically signed by: Felix Pacini, DO [facility] [place]

## 2023-10-14 NOTE — Patient Instructions (Addendum)
Return in about 24 weeks (around 03/30/2024) for cpe (20 min), Routine chronic condition follow-up.        Great to see you today.  I have refilled the medication(s) we provide.   If labs were collected or images ordered, we will inform you of  results once we have received them and reviewed. We will contact you either by echart message, or telephone call.  Please give ample time to the testing facility, and our office to run,  receive and review results. Please do not call inquiring of results, even if you can see them in your chart. We will contact you as soon as we are able. If it has been over 1 week since the test was completed, and you have not yet heard from [place], then please call us.    - echart message- for normal results that have been seen by the patient already.   - telephone call: abnormal results or if patient has not viewed results in their echart.  If a referral to a specialist was entered for you, please call us in 2 weeks if you have not heard from the specialist office to schedule.

## 2023-10-20 DIAGNOSIS — M531 Cervicobrachial syndrome: Secondary | ICD-10-CM | POA: Diagnosis not present

## 2023-10-20 DIAGNOSIS — M9903 Segmental and somatic dysfunction of lumbar region: Secondary | ICD-10-CM | POA: Diagnosis not present

## 2023-10-20 DIAGNOSIS — M5386 Other specified dorsopathies, lumbar region: Secondary | ICD-10-CM | POA: Diagnosis not present

## 2023-10-20 DIAGNOSIS — M9904 Segmental and somatic dysfunction of sacral region: Secondary | ICD-10-CM | POA: Diagnosis not present

## 2023-11-07 ENCOUNTER — Other Ambulatory Visit: Payer: Self-pay | Admitting: Family Medicine

## 2023-11-10 DIAGNOSIS — M531 Cervicobrachial syndrome: Secondary | ICD-10-CM | POA: Diagnosis not present

## 2023-11-10 DIAGNOSIS — M9904 Segmental and somatic dysfunction of sacral region: Secondary | ICD-10-CM | POA: Diagnosis not present

## 2023-11-10 DIAGNOSIS — M9903 Segmental and somatic dysfunction of lumbar region: Secondary | ICD-10-CM | POA: Diagnosis not present

## 2023-11-10 DIAGNOSIS — M5386 Other specified dorsopathies, lumbar region: Secondary | ICD-10-CM | POA: Diagnosis not present

## 2023-11-24 DIAGNOSIS — M9904 Segmental and somatic dysfunction of sacral region: Secondary | ICD-10-CM | POA: Diagnosis not present

## 2023-11-24 DIAGNOSIS — M9903 Segmental and somatic dysfunction of lumbar region: Secondary | ICD-10-CM | POA: Diagnosis not present

## 2023-11-24 DIAGNOSIS — M5386 Other specified dorsopathies, lumbar region: Secondary | ICD-10-CM | POA: Diagnosis not present

## 2023-11-24 DIAGNOSIS — M531 Cervicobrachial syndrome: Secondary | ICD-10-CM | POA: Diagnosis not present

## 2023-12-08 DIAGNOSIS — M5386 Other specified dorsopathies, lumbar region: Secondary | ICD-10-CM | POA: Diagnosis not present

## 2023-12-08 DIAGNOSIS — M9904 Segmental and somatic dysfunction of sacral region: Secondary | ICD-10-CM | POA: Diagnosis not present

## 2023-12-08 DIAGNOSIS — M531 Cervicobrachial syndrome: Secondary | ICD-10-CM | POA: Diagnosis not present

## 2023-12-08 DIAGNOSIS — M9903 Segmental and somatic dysfunction of lumbar region: Secondary | ICD-10-CM | POA: Diagnosis not present

## 2023-12-22 DIAGNOSIS — M5386 Other specified dorsopathies, lumbar region: Secondary | ICD-10-CM | POA: Diagnosis not present

## 2023-12-22 DIAGNOSIS — M9904 Segmental and somatic dysfunction of sacral region: Secondary | ICD-10-CM | POA: Diagnosis not present

## 2023-12-22 DIAGNOSIS — M9903 Segmental and somatic dysfunction of lumbar region: Secondary | ICD-10-CM | POA: Diagnosis not present

## 2023-12-22 DIAGNOSIS — M531 Cervicobrachial syndrome: Secondary | ICD-10-CM | POA: Diagnosis not present

## 2024-01-05 DIAGNOSIS — M9903 Segmental and somatic dysfunction of lumbar region: Secondary | ICD-10-CM | POA: Diagnosis not present

## 2024-01-05 DIAGNOSIS — M5386 Other specified dorsopathies, lumbar region: Secondary | ICD-10-CM | POA: Diagnosis not present

## 2024-01-05 DIAGNOSIS — M531 Cervicobrachial syndrome: Secondary | ICD-10-CM | POA: Diagnosis not present

## 2024-01-05 DIAGNOSIS — M9904 Segmental and somatic dysfunction of sacral region: Secondary | ICD-10-CM | POA: Diagnosis not present

## 2024-01-19 DIAGNOSIS — M531 Cervicobrachial syndrome: Secondary | ICD-10-CM | POA: Diagnosis not present

## 2024-01-19 DIAGNOSIS — M9904 Segmental and somatic dysfunction of sacral region: Secondary | ICD-10-CM | POA: Diagnosis not present

## 2024-01-19 DIAGNOSIS — M9903 Segmental and somatic dysfunction of lumbar region: Secondary | ICD-10-CM | POA: Diagnosis not present

## 2024-01-19 DIAGNOSIS — M5386 Other specified dorsopathies, lumbar region: Secondary | ICD-10-CM | POA: Diagnosis not present

## 2024-02-02 DIAGNOSIS — M9903 Segmental and somatic dysfunction of lumbar region: Secondary | ICD-10-CM | POA: Diagnosis not present

## 2024-02-02 DIAGNOSIS — M9904 Segmental and somatic dysfunction of sacral region: Secondary | ICD-10-CM | POA: Diagnosis not present

## 2024-02-02 DIAGNOSIS — M5386 Other specified dorsopathies, lumbar region: Secondary | ICD-10-CM | POA: Diagnosis not present

## 2024-02-02 DIAGNOSIS — M531 Cervicobrachial syndrome: Secondary | ICD-10-CM | POA: Diagnosis not present

## 2024-02-16 DIAGNOSIS — M9904 Segmental and somatic dysfunction of sacral region: Secondary | ICD-10-CM | POA: Diagnosis not present

## 2024-02-16 DIAGNOSIS — M531 Cervicobrachial syndrome: Secondary | ICD-10-CM | POA: Diagnosis not present

## 2024-02-16 DIAGNOSIS — M5386 Other specified dorsopathies, lumbar region: Secondary | ICD-10-CM | POA: Diagnosis not present

## 2024-02-16 DIAGNOSIS — M9903 Segmental and somatic dysfunction of lumbar region: Secondary | ICD-10-CM | POA: Diagnosis not present

## 2024-02-26 DIAGNOSIS — Z1231 Encounter for screening mammogram for malignant neoplasm of breast: Secondary | ICD-10-CM | POA: Diagnosis not present

## 2024-02-26 LAB — HM MAMMOGRAPHY

## 2024-03-01 DIAGNOSIS — M9904 Segmental and somatic dysfunction of sacral region: Secondary | ICD-10-CM | POA: Diagnosis not present

## 2024-03-01 DIAGNOSIS — M531 Cervicobrachial syndrome: Secondary | ICD-10-CM | POA: Diagnosis not present

## 2024-03-01 DIAGNOSIS — M9903 Segmental and somatic dysfunction of lumbar region: Secondary | ICD-10-CM | POA: Diagnosis not present

## 2024-03-01 DIAGNOSIS — M5386 Other specified dorsopathies, lumbar region: Secondary | ICD-10-CM | POA: Diagnosis not present

## 2024-03-25 DIAGNOSIS — D225 Melanocytic nevi of trunk: Secondary | ICD-10-CM | POA: Diagnosis not present

## 2024-03-25 DIAGNOSIS — L578 Other skin changes due to chronic exposure to nonionizing radiation: Secondary | ICD-10-CM | POA: Diagnosis not present

## 2024-03-25 DIAGNOSIS — L814 Other melanin hyperpigmentation: Secondary | ICD-10-CM | POA: Diagnosis not present

## 2024-03-25 DIAGNOSIS — L821 Other seborrheic keratosis: Secondary | ICD-10-CM | POA: Diagnosis not present

## 2024-03-29 DIAGNOSIS — M531 Cervicobrachial syndrome: Secondary | ICD-10-CM | POA: Diagnosis not present

## 2024-03-29 DIAGNOSIS — M5386 Other specified dorsopathies, lumbar region: Secondary | ICD-10-CM | POA: Diagnosis not present

## 2024-03-29 DIAGNOSIS — M9904 Segmental and somatic dysfunction of sacral region: Secondary | ICD-10-CM | POA: Diagnosis not present

## 2024-03-29 DIAGNOSIS — M9903 Segmental and somatic dysfunction of lumbar region: Secondary | ICD-10-CM | POA: Diagnosis not present

## 2024-03-31 ENCOUNTER — Encounter: Payer: Self-pay | Admitting: Family Medicine

## 2024-03-31 ENCOUNTER — Ambulatory Visit (INDEPENDENT_AMBULATORY_CARE_PROVIDER_SITE_OTHER): Payer: BC Managed Care – PPO | Admitting: Family Medicine

## 2024-03-31 VITALS — BP 122/80 | HR 75 | Temp 98.0°F | Ht 63.5 in | Wt 184.0 lb

## 2024-03-31 DIAGNOSIS — Z5181 Encounter for therapeutic drug level monitoring: Secondary | ICD-10-CM | POA: Insufficient documentation

## 2024-03-31 DIAGNOSIS — F419 Anxiety disorder, unspecified: Secondary | ICD-10-CM

## 2024-03-31 DIAGNOSIS — Z23 Encounter for immunization: Secondary | ICD-10-CM

## 2024-03-31 DIAGNOSIS — Z79899 Other long term (current) drug therapy: Secondary | ICD-10-CM | POA: Diagnosis not present

## 2024-03-31 DIAGNOSIS — F410 Panic disorder [episodic paroxysmal anxiety] without agoraphobia: Secondary | ICD-10-CM | POA: Diagnosis not present

## 2024-03-31 DIAGNOSIS — K219 Gastro-esophageal reflux disease without esophagitis: Secondary | ICD-10-CM | POA: Diagnosis not present

## 2024-03-31 DIAGNOSIS — Z1231 Encounter for screening mammogram for malignant neoplasm of breast: Secondary | ICD-10-CM | POA: Diagnosis not present

## 2024-03-31 DIAGNOSIS — E663 Overweight: Secondary | ICD-10-CM

## 2024-03-31 DIAGNOSIS — Z0000 Encounter for general adult medical examination without abnormal findings: Secondary | ICD-10-CM

## 2024-03-31 DIAGNOSIS — Z131 Encounter for screening for diabetes mellitus: Secondary | ICD-10-CM

## 2024-03-31 DIAGNOSIS — Z13220 Encounter for screening for lipoid disorders: Secondary | ICD-10-CM | POA: Diagnosis not present

## 2024-03-31 LAB — HEMOGLOBIN A1C: Hgb A1c MFr Bld: 5.7 % (ref 4.6–6.5)

## 2024-03-31 LAB — COMPREHENSIVE METABOLIC PANEL WITH GFR
ALT: 36 U/L — ABNORMAL HIGH (ref 0–35)
AST: 25 U/L (ref 0–37)
Albumin: 4.8 g/dL (ref 3.5–5.2)
Alkaline Phosphatase: 90 U/L (ref 39–117)
BUN: 14 mg/dL (ref 6–23)
CO2: 29 meq/L (ref 19–32)
Calcium: 9.8 mg/dL (ref 8.4–10.5)
Chloride: 102 meq/L (ref 96–112)
Creatinine, Ser: 0.78 mg/dL (ref 0.40–1.20)
GFR: 86.19 mL/min (ref >60.00)
Glucose, Bld: 104 mg/dL — ABNORMAL HIGH (ref 70–99)
Potassium: 4.4 meq/L (ref 3.5–5.1)
Sodium: 138 meq/L (ref 135–145)
Total Bilirubin: 0.3 mg/dL (ref 0.2–1.2)
Total Protein: 7.1 g/dL (ref 6.0–8.3)

## 2024-03-31 LAB — CBC
HCT: 42 % (ref 36.0–46.0)
Hemoglobin: 13.7 g/dL (ref 12.0–15.0)
MCHC: 32.7 g/dL (ref 30.0–36.0)
MCV: 93.5 fl (ref 78.0–100.0)
Platelets: 262 10*3/uL (ref 150.0–400.0)
RBC: 4.49 Mil/uL (ref 3.87–5.11)
RDW: 13.9 % (ref 11.5–15.5)
WBC: 3.2 10*3/uL — ABNORMAL LOW (ref 4.0–10.5)

## 2024-03-31 LAB — VITAMIN D 25 HYDROXY (VIT D DEFICIENCY, FRACTURES): VITD: 33.12 ng/mL (ref 30.00–100.00)

## 2024-03-31 LAB — LIPID PANEL
Cholesterol: 182 mg/dL (ref 0–200)
HDL: 83 mg/dL (ref >39.00)
LDL Cholesterol: 89 mg/dL (ref 0–99)
NonHDL: 99.36
Total CHOL/HDL Ratio: 2
Triglycerides: 53 mg/dL (ref 0.0–149.0)
VLDL: 10.6 mg/dL (ref 0.0–40.0)

## 2024-03-31 LAB — TSH: TSH: 0.82 u[IU]/mL (ref 0.35–5.50)

## 2024-03-31 LAB — MAGNESIUM: Magnesium: 2.1 mg/dL (ref 1.5–2.5)

## 2024-03-31 LAB — B12 AND FOLATE PANEL
Folate: 8.9 ng/mL (ref >5.9)
Vitamin B-12: 320 pg/mL (ref 211–911)

## 2024-03-31 MED ORDER — ALPRAZOLAM 0.5 MG PO TABS: 0.5000 mg | ORAL_TABLET | Freq: Every day | ORAL | 1 refills | Status: AC | PRN

## 2024-03-31 MED ORDER — PANTOPRAZOLE SODIUM 20 MG PO TBEC: 20.0000 mg | DELAYED_RELEASE_TABLET | Freq: Every day | ORAL | 1 refills | Status: AC

## 2024-03-31 NOTE — Patient Instructions (Addendum)

## 2024-03-31 NOTE — Addendum Note (Signed)
 Addended by: Janalynn Eder A on: 03/31/2024 09:10 AM   Modules accepted: Level of Service

## 2024-03-31 NOTE — Progress Notes (Signed)
 Patient ID: Tina Hood, female  DOB: 1971/04/13, 53 y.o.   MRN: [id number] Patient Care Team    Relationship Specialty Notifications Start End  Mariel Shope, DO PCP - General Family Medicine  06/19/21   Shermon Divine, MD Consulting Physician Sports Medicine  06/24/21   Luan Rumpf, MD Consulting Physician Obstetrics  06/24/21    Comment: [facility]  Brandt Cake, [place] Consulting Physician Podiatry  06/24/21   Dillon, Caitlin K, MD  Family Medicine  10/24/21     Chief Complaint  Patient presents with   Annual Exam    Chronic Conditions/illness Management. Pt is fasting.      Subjective: Tina Hood is a 53 y.o.  Female  present for CPE and chronic condition management All past medical history, surgical history, allergies, family history, immunizations, medications and social history were updated in the electronic medical record today. All recent labs, ED visits and hospitalizations within the last year were reviewed.  Anxiety/panic: Pt reports she is no longer using the  lexapro  10 mg and feels well. She does have a prescription for Xanax  in which she uses rarely for panic attacks. Prior note Patient reports she has had anxiety and panic attacks since she was in high school.  She has been prescribed Xanax  0.5 mg daily as needed for panic for many years.  She states she uses this medication infrequently.  Secondary to her mother's health is causing her added stress.    Prior note Patient reports she has had anxiety and panic attacks since she was in high school.  She has been prescribed Xanax  0.5 mg daily as needed for panic for many years.  She states she uses this medication infrequently.  Secondary to her mother's health is causing her added stress.    Health maintenance:  Colonoscopy: scheduled for 5/4//2023. [facility]- Dr. Feliberto Hopping- 9 yr Mammogram: completed: 02/26/2024 [facility]> ordered for 2026 Cervical cancer screening: last pap:  04/22/2022- WNL-neg HPV-gyn Immunizations: tdap due, Influenza UTD  (encouraged yearly), shingrix  series completed.  Infectious disease screening: HIV and Hep C completed DEXA: routine screen Assistive device: none Oxygen WUJ:WJXB Patient has a Dental home. Hospitalizations/ED visits: Reviewed      03/31/2024    8:47 AM 04/07/2023    9:47 AM 09/19/2022    9:14 AM 03/04/2022   10:53 AM 09/17/2021   10:12 AM  Depression screen PHQ 2/9  Decreased Interest 0 0 0 0 1  Down, Depressed, Hopeless 0 0 0 0 0  PHQ - 2 Score 0 0 0 0 1  Altered sleeping 1  2 1 1   Tired, decreased energy 0  1 0 0  Change in appetite 0   0 0  Feeling bad or failure about yourself  0  0 0 0  Trouble concentrating 0   0 2  Moving slowly or fidgety/restless 0  0 0 0  Suicidal thoughts 0  0 0 0  PHQ-9 Score 1  3 1 4   Difficult doing work/chores Not difficult at all          03/31/2024    8:47 AM 04/07/2023    9:47 AM 09/19/2022    9:14 AM 03/04/2022   10:53 AM  GAD 7 : Generalized Anxiety Score  Nervous, Anxious, on Edge 1 1 1 1   Control/stop worrying 0 0 1 1  Worry too much - different things 0 1 1 2   Trouble relaxing 0 1 1 1  Restless 1 1 0 2  Easily annoyed or irritable 0 1 0 0  Afraid - awful might happen 0 0 1 1  Total GAD 7 Score 2 5 5 8   Anxiety Difficulty Not difficult at all Not difficult at all      Immunization History  Administered Date(s) Administered   Influenza Inj Mdck Quad Pf 07/28/2022   Influenza Split 08/08/2009, 08/03/2010, 08/04/2012, 08/03/2013, 07/12/2020   Influenza, Seasonal, Injecte, Preservative Fre 08/06/2023   Influenza,inj,Quad PF,6+ Mos 09/07/2014, 08/22/2015, 09/23/2016   Influenza-Unspecified 08/08/2009, 08/03/2010, 08/04/2012, 08/03/2013, 07/12/2020, 09/02/2021   PFIZER(Purple Top)SARS-COV-2 Vaccination 01/27/2020, 02/17/2020, 10/06/2020   Tdap 10/04/2003, 11/22/2013, 03/31/2024   Zoster Recombinant(Shingrix ) 06/24/2021, 09/17/2021   Past Medical History:  Diagnosis  Date   Anxiety    Arthritis    Horse bite 01/27/2021   followed by gyn   Leukopenia    mild lower WBC chronically - stable   Migraines    Allergies  Allergen Reactions   Bee Venom Anaphylaxis   Daypro [Oxaprozin] Rash   Lodine [Etodolac] Rash   Past Surgical History:  Procedure Laterality Date   CYSTECTOMY     GANGLION CYST EXCISION     Family History  Problem Relation Age of Onset   Hyperlipidemia Mother    Hypertension Mother    Arthritis Mother    Skin cancer Mother    Hearing loss Mother    Atrial fibrillation Mother    Hypertension Father    Hyperlipidemia Father    Hearing loss Father    Arthritis Father    Prostate cancer Father    Asthma Sister    Arthritis Sister    Miscarriages / [place] Sister    Miscarriages / [place] Sister    Arthritis Sister    Hearing loss Maternal Grandmother    Arthritis Maternal Grandmother    Early death Paternal Grandmother    Alcohol abuse Paternal Grandfather    Liver cancer Paternal Grandfather    Social History   Social History Narrative   Marital status/children/pets: married, G2P2   Education/employment: B.A.   Safety:      -smoke alarm in the home:Yes     - wears seatbelt: Yes     - Feels safe in their relationships: Yes       Allergies as of 03/31/2024       Reactions   Bee Venom Anaphylaxis   Daypro [oxaprozin] Rash   Lodine [etodolac] Rash        Medication List        Accurate as of Mar 31, 2024  9:10 AM. If you have any questions, ask your nurse or doctor.          STOP taking these medications    BENADRYL ALLERGY PO Stopped by: Napolean Backbone   cetirizine-pseudoephedrine 5-120 MG tablet Commonly known as: ZYRTEC-D Stopped by: Napolean Backbone   escitalopram  10 MG tablet Commonly known as: Lexapro  Stopped by: Napolean Backbone       TAKE these medications    albuterol  108 (90 Base) MCG/ACT inhaler Commonly known as: VENTOLIN  HFA Inhale 1-2 puffs into the lungs every 6 (six)  hours as needed for wheezing or shortness of breath.   ALPRAZolam  0.5 MG tablet Commonly known as: XANAX  Take 1 tablet (0.5 mg total) by mouth daily as needed for anxiety.   clobetasol 0.05 % external solution Commonly known as: TEMOVATE SMARTSIG:Topical 1-2 Times Daily PRN   OVER THE COUNTER MEDICATION AG1 -probiotic   pantoprazole  20 MG tablet Commonly  known as: PROTONIX  Take 1 tablet (20 mg total) by mouth daily.   triamcinolone  cream 0.1 % Commonly known as: KENALOG  Apply topically 2 (two) times daily.        All past medical history, surgical history, allergies, family history, immunizations andmedications were updated in the EMR today and reviewed under the history and medication portions of their EMR.     Recent Results (from the past 2160 hours)  HM MAMMOGRAPHY     Status: None   Collection Time: 02/26/24  3:32 PM  Result Value Ref Range   HM Mammogram 0-4 Bi-Rad 0-4 Bi-Rad, Self Reported Normal    No results found.   ROS: 14 pt review of systems performed and negative (unless mentioned in an HPI)  Objective: BP 122/80   Pulse 75   Temp 98 F (36.7 C)   Ht 5' 3.5" (1.613 m)   Wt 184 lb (83.5 kg)   SpO2 98%   BMI 32.08 kg/m  Physical Exam Vitals and nursing note reviewed.  Constitutional:      General: She is not in acute distress.    Appearance: Normal appearance. She is not ill-appearing or toxic-appearing.  HENT:     Head: Normocephalic and atraumatic.     Right Ear: Tympanic membrane, ear canal and external ear normal. There is no impacted cerumen.     Left Ear: Tympanic membrane, ear canal and external ear normal. There is no impacted cerumen.     Nose: No congestion or rhinorrhea.     Mouth/Throat:     Mouth: Mucous membranes are moist.     Pharynx: Oropharynx is clear. No oropharyngeal exudate or posterior oropharyngeal erythema.  Eyes:     General: No scleral icterus.       Right eye: No discharge.        Left eye: No discharge.      Extraocular Movements: Extraocular movements intact.     Conjunctiva/sclera: Conjunctivae normal.     Pupils: Pupils are equal, round, and reactive to light.  Cardiovascular:     Rate and Rhythm: Normal rate and regular rhythm.     Pulses: Normal pulses.     Heart sounds: Normal heart sounds. No murmur heard.    No friction rub. No gallop.  Pulmonary:     Effort: Pulmonary effort is normal. No respiratory distress.     Breath sounds: Normal breath sounds. No stridor. No wheezing, rhonchi or rales.  Chest:     Chest wall: No tenderness.  Abdominal:     General: Abdomen is flat. Bowel sounds are normal. There is no distension.     Palpations: Abdomen is soft. There is no mass.     Tenderness: There is no abdominal tenderness. There is no right CVA tenderness, left CVA tenderness, guarding or rebound.     Hernia: No hernia is present.  Musculoskeletal:        General: No swelling, tenderness or deformity. Normal range of motion.     Cervical back: Normal range of motion and neck supple. No rigidity or tenderness.     Right lower leg: No edema.     Left lower leg: No edema.  Lymphadenopathy:     Cervical: No cervical adenopathy.  Skin:    General: Skin is warm and dry.     Coloration: Skin is not jaundiced or pale.     Findings: No bruising, erythema, lesion or rash.  Neurological:     General: No focal deficit present.  Mental Status: She is alert and oriented to person, place, and time. Mental status is at baseline.     Cranial Nerves: No cranial nerve deficit.     Sensory: No sensory deficit.     Motor: No weakness.     Coordination: Coordination normal.     Gait: Gait normal.     Deep Tendon Reflexes: Reflexes normal.  Psychiatric:        Mood and Affect: Mood normal.        Behavior: Behavior normal.        Thought Content: Thought content normal.        Judgment: Judgment normal.     No results found.  Assessment/plan: Ozzie Remmers is a 53 y.o. female present  for CPE and chronic condition appointment Anxiety/Panic disorder (episodic paroxysmal anxiety) Stable Patient declining referral to therapist, at least for now. No longer taking lexapro  and feeling ok.  Continue Xanax  0.5 mg daily as needed-face-to-face visit needed for any refills.  Gastroesophageal reflux disease without esophagitis/Encounter for monitoring long-term proton pump inhibitor therapy Taking PPI now less frequently, but sometimes She reports she has been low in b12 and mag in the past - B12 and Folate Panel - Vitamin D  (25 hydroxy) - Magnesium  E66.3 (BMI 25.0-29.9) - Hemoglobin A1c - Lipid panel Need for Tdap vaccination Administered today Lipid screening - Lipid panel Diabetes mellitus screening - Hemoglobin A1c  Routine general medical examination at a health care facility (Primary) - CBC - Comprehensive metabolic panel with GFR - TSH Patient was encouraged to exercise greater than 150 minutes a week. Patient was encouraged to choose a diet filled with fresh fruits and vegetables, and lean meats. AVS provided to patient today for education/recommendation on gender specific health and safety maintenance. Colonoscopy: scheduled for 5/4//2023. [facility]- Dr. Feliberto Hopping- 10 yr Mammogram: completed: 02/26/20[address] [facility]> ordered for 2026 Cervical cancer screening: last pap: 04/22/2022- WNL-neg HPV-gyn Immunizations: tdap due, Influenza UTD  (encouraged yearly), shingrix  series completed.  Infectious disease screening: HIV and Hep C completed DEXA: routine screen  Return in about 1 year (around 04/01/2025) for cpe (20 min), Routine chronic condition follow-up.  Orders Placed This Encounter  Procedures   MM 3D SCREENING MAMMOGRAM BILATERAL BREAST   Tdap vaccine greater than or equal to 7yo IM   CBC   Comprehensive metabolic panel with GFR   Hemoglobin A1c   TSH   Lipid panel   B12 and Folate Panel   Vitamin D  (25 hydroxy)   Magnesium    Meds ordered this  encounter  Medications   ALPRAZolam  (XANAX ) 0.5 MG tablet    Sig: Take 1 tablet (0.5 mg total) by mouth daily as needed for anxiety.    Dispense:  90 tablet    Refill:  1   pantoprazole  (PROTONIX ) 20 MG tablet    Sig: Take 1 tablet (20 mg total) by mouth daily.    Dispense:  90 tablet    Refill:  1    Referral Orders  No referral(s) requested today     Electronically signed by: Napolean Backbone, DO [facility] [place]

## 2024-04-01 ENCOUNTER — Ambulatory Visit: Payer: Self-pay | Admitting: Family Medicine

## 2024-04-06 ENCOUNTER — Telehealth: Payer: Self-pay

## 2024-04-06 NOTE — Telephone Encounter (Signed)
 Not sure how she was marked as a NS but pt was seen by PCP and had labs completed same day. I don't believe a fee was added but just want to confirm.

## 2024-04-06 NOTE — Telephone Encounter (Signed)
 Copied from CRM [id number]. Topic: General - Other >> Apr 04, 2024 11:27 AM Clyde Darling P wrote: Reason for CRM: Pt advise she receive a notice on mychart stating she was a NCNS on 05/29 but Pt was there at visit- she want to be notified she will not be charge a fee as she did attend the visit- [phone]

## 2024-05-11 NOTE — Telephone Encounter (Signed)
 LM for pt to return call to discuss.

## 2024-05-31 ENCOUNTER — Encounter: Payer: Self-pay | Admitting: Family Medicine

## 2024-06-14 DIAGNOSIS — Z01419 Encounter for gynecological examination (general) (routine) without abnormal findings: Secondary | ICD-10-CM | POA: Diagnosis not present

## 2025-04-03 ENCOUNTER — Encounter: Admitting: Family Medicine
